# Patient Record
Sex: Female | Born: 1935 | Race: White | Hispanic: No | Smoking: Former smoker
Health system: Southern US, Community
[De-identification: ages and names within clinical notes are randomized; demographics above are authoritative.]

## PROBLEM LIST (undated history)

## (undated) DIAGNOSIS — R52 Pain, unspecified: Secondary | ICD-10-CM

## (undated) DIAGNOSIS — I509 Heart failure, unspecified: Secondary | ICD-10-CM

## (undated) DIAGNOSIS — E039 Hypothyroidism, unspecified: Secondary | ICD-10-CM

## (undated) DIAGNOSIS — E785 Hyperlipidemia, unspecified: Secondary | ICD-10-CM

## (undated) DIAGNOSIS — J40 Bronchitis, not specified as acute or chronic: Secondary | ICD-10-CM

## (undated) DIAGNOSIS — I10 Essential (primary) hypertension: Secondary | ICD-10-CM

## (undated) DIAGNOSIS — D649 Anemia, unspecified: Secondary | ICD-10-CM

## (undated) DIAGNOSIS — K219 Gastro-esophageal reflux disease without esophagitis: Secondary | ICD-10-CM

## (undated) DIAGNOSIS — E7800 Pure hypercholesterolemia, unspecified: Secondary | ICD-10-CM

## (undated) HISTORY — PX: WRIST SURGERY: SHX841

## (undated) HISTORY — PX: ABDOMINAL HYSTERECTOMY: SHX81

---

## 2009-02-15 ENCOUNTER — Emergency Department (HOSPITAL_COMMUNITY): Admission: EM | Admit: 2009-02-15 | Discharge: 2009-02-15 | Payer: Self-pay | Admitting: Emergency Medicine

## 2010-12-16 LAB — DIFFERENTIAL
Basophils Absolute: 0 10*3/uL (ref 0.0–0.1)
Basophils Relative: 0 % (ref 0–1)
Eosinophils Absolute: 0.1 10*3/uL (ref 0.0–0.7)
Neutro Abs: 4.6 10*3/uL (ref 1.7–7.7)
Neutrophils Relative %: 74 % (ref 43–77)

## 2010-12-16 LAB — POCT CARDIAC MARKERS
Myoglobin, poc: 63.3 ng/mL (ref 12–200)
Myoglobin, poc: 84.2 ng/mL (ref 12–200)
Troponin i, poc: <0.05 ng/mL (ref 0.00–0.09)

## 2010-12-16 LAB — POCT I-STAT, CHEM 8
Calcium, Ion: 1.22 mmol/L (ref 1.12–1.32)
Creatinine, Ser: 1.3 mg/dL — ABNORMAL HIGH (ref 0.4–1.2)
Hemoglobin: 14.6 g/dL (ref 12.0–15.0)
Sodium: 141 mEq/L (ref 135–145)
TCO2: 28 mmol/L (ref 0–100)

## 2010-12-16 LAB — CBC
MCHC: 34 g/dL (ref 30.0–36.0)
MCV: 92.9 fL (ref 78.0–100.0)
RDW: 14.1 % (ref 11.5–15.5)

## 2010-12-16 LAB — LIPASE, BLOOD: Lipase: 21 U/L (ref 11–59)

## 2011-07-20 ENCOUNTER — Encounter: Payer: Self-pay | Admitting: *Deleted

## 2011-07-20 ENCOUNTER — Emergency Department (HOSPITAL_COMMUNITY): Payer: Medicare (Managed Care)

## 2011-07-20 ENCOUNTER — Emergency Department (HOSPITAL_COMMUNITY)
Admission: EM | Admit: 2011-07-20 | Discharge: 2011-07-28 | Disposition: A | Discharge status: Other Acute Inpatient Hospital | Payer: Medicare (Managed Care) | Attending: Emergency Medicine | Admitting: Emergency Medicine

## 2011-07-20 DIAGNOSIS — F411 Generalized anxiety disorder: Secondary | ICD-10-CM | POA: Insufficient documentation

## 2011-07-20 DIAGNOSIS — E039 Hypothyroidism, unspecified: Secondary | ICD-10-CM | POA: Insufficient documentation

## 2011-07-20 DIAGNOSIS — F29 Unspecified psychosis not due to a substance or known physiological condition: Secondary | ICD-10-CM | POA: Insufficient documentation

## 2011-07-20 DIAGNOSIS — F419 Anxiety disorder, unspecified: Secondary | ICD-10-CM

## 2011-07-20 DIAGNOSIS — I517 Cardiomegaly: Secondary | ICD-10-CM | POA: Insufficient documentation

## 2011-07-20 DIAGNOSIS — K449 Diaphragmatic hernia without obstruction or gangrene: Secondary | ICD-10-CM | POA: Insufficient documentation

## 2011-07-20 DIAGNOSIS — I4891 Unspecified atrial fibrillation: Secondary | ICD-10-CM | POA: Insufficient documentation

## 2011-07-20 DIAGNOSIS — E7800 Pure hypercholesterolemia, unspecified: Secondary | ICD-10-CM | POA: Insufficient documentation

## 2011-07-20 DIAGNOSIS — I10 Essential (primary) hypertension: Secondary | ICD-10-CM | POA: Insufficient documentation

## 2011-07-20 DIAGNOSIS — N390 Urinary tract infection, site not specified: Secondary | ICD-10-CM | POA: Insufficient documentation

## 2011-07-20 DIAGNOSIS — M5030 Other cervical disc degeneration, unspecified cervical region: Secondary | ICD-10-CM | POA: Insufficient documentation

## 2011-07-20 DIAGNOSIS — J4489 Other specified chronic obstructive pulmonary disease: Secondary | ICD-10-CM | POA: Insufficient documentation

## 2011-07-20 DIAGNOSIS — S0990XA Unspecified injury of head, initial encounter: Secondary | ICD-10-CM | POA: Insufficient documentation

## 2011-07-20 DIAGNOSIS — G319 Degenerative disease of nervous system, unspecified: Secondary | ICD-10-CM | POA: Insufficient documentation

## 2011-07-20 DIAGNOSIS — W108XXA Fall (on) (from) other stairs and steps, initial encounter: Secondary | ICD-10-CM | POA: Insufficient documentation

## 2011-07-20 DIAGNOSIS — J449 Chronic obstructive pulmonary disease, unspecified: Secondary | ICD-10-CM | POA: Insufficient documentation

## 2011-07-20 DIAGNOSIS — R6889 Other general symptoms and signs: Secondary | ICD-10-CM | POA: Insufficient documentation

## 2011-07-20 DIAGNOSIS — Z79899 Other long term (current) drug therapy: Secondary | ICD-10-CM | POA: Insufficient documentation

## 2011-07-20 HISTORY — DX: Hypothyroidism, unspecified: E03.9

## 2011-07-20 HISTORY — DX: Pure hypercholesterolemia, unspecified: E78.00

## 2011-07-20 HISTORY — DX: Essential (primary) hypertension: I10

## 2011-07-20 HISTORY — DX: Pain, unspecified: R52

## 2011-07-20 HISTORY — DX: Gastro-esophageal reflux disease without esophagitis: K21.9

## 2011-07-20 LAB — COMPREHENSIVE METABOLIC PANEL
ALT: 18 U/L (ref 0–35)
AST: 28 U/L (ref 0–37)
Alkaline Phosphatase: 76 U/L (ref 39–117)
Calcium: 10.6 mg/dL — ABNORMAL HIGH (ref 8.4–10.5)
GFR calc Af Amer: 57 mL/min — ABNORMAL LOW (ref >90)
Glucose, Bld: 92 mg/dL (ref 70–99)
Potassium: 4.2 mEq/L (ref 3.5–5.1)
Sodium: 139 mEq/L (ref 135–145)
Total Protein: 6.8 g/dL (ref 6.0–8.3)

## 2011-07-20 LAB — URINALYSIS, ROUTINE W REFLEX MICROSCOPIC
Bilirubin Urine: NEGATIVE
Glucose, UA: NEGATIVE mg/dL
Hgb urine dipstick: NEGATIVE
Specific Gravity, Urine: 1.02 (ref 1.005–1.030)
pH: 5 (ref 5.0–8.0)

## 2011-07-20 LAB — CBC
HCT: 43.3 % (ref 36.0–46.0)
Hemoglobin: 14.7 g/dL (ref 12.0–15.0)
MCH: 31.8 pg (ref 26.0–34.0)
MCHC: 33.9 g/dL (ref 30.0–36.0)
MCV: 93.7 fL (ref 78.0–100.0)
RBC: 4.62 MIL/uL (ref 3.87–5.11)

## 2011-07-20 LAB — URINE MICROSCOPIC-ADD ON

## 2011-07-20 MED ORDER — SIMVASTATIN 10 MG PO TABS
10.0000 mg | ORAL_TABLET | Freq: Every day | ORAL | Status: DC
  Administered 2011-07-24 – 2011-07-27 (×4): 10 mg via ORAL
  Filled 2011-07-20 (×12): qty 1

## 2011-07-20 MED ORDER — RISPERIDONE 0.5 MG PO TABS
0.5000 mg | ORAL_TABLET | Freq: Every day | ORAL | Status: AC
  Filled 2011-07-20 (×2): qty 1

## 2011-07-20 MED ORDER — LOSARTAN POTASSIUM 50 MG PO TABS
50.0000 mg | ORAL_TABLET | Freq: Every day | ORAL | Status: DC
  Administered 2011-07-21 – 2011-07-27 (×6): 50 mg via ORAL
  Filled 2011-07-20 (×12): qty 1

## 2011-07-20 MED ORDER — CEPHALEXIN 500 MG PO CAPS
500.0000 mg | ORAL_CAPSULE | Freq: Once | ORAL | Status: AC
  Administered 2011-07-20: 500 mg via ORAL
  Filled 2011-07-20: qty 1

## 2011-07-20 MED ORDER — LEVOTHYROXINE SODIUM 75 MCG PO TABS
75.0000 ug | ORAL_TABLET | Freq: Every day | ORAL | Status: DC
  Administered 2011-07-21 – 2011-07-28 (×7): 75 ug via ORAL
  Filled 2011-07-20 (×10): qty 1

## 2011-07-20 NOTE — BH Assessment (Signed)
Assessment Note   Yvonne Donovan is an 77 y.o. white  female.  PRESENTING PROBLEM: Yvonne Donovan was brought to [facility] Emergency Room because she locked her self in the bath room and called 911.  Doyle reported to 911 that, "a man named Jillyn Hidden was trying to kill her and that this man had already killed her other Donovan.  Yvonne Donovan reports seeing and hearing voices and shadows.  Yvonne Donovan and her Donovan report that she has been displaying this type of behaviors for the past month.  Pt. Denies any SI/HI.  Yvonne Donovan reports that her daughter in law is in a relationship with her hallucination, "Xandra Laramee reports that she is afraid to come to her home with her Donovan.  Yvonne Donovan's Donovan reports that she fell down a flight of stairs in July 2012.  Yvonne Donovan reports that on Tuesday of last week his mother went outside at 2:30 am by herself.  Yvonne Donovan reports that she is not able to sleep because she is afraid for her life.  Yvonne Donovan reports that her family has been lying to her and they are allowing a man named Jillyn Hidden to steal from her.  Therefore, his mother was presents a present danger to herself due to her level of psychosis.  Yvonne Donovan's medical reports states that she is in very good health and her only medical issues are associated with high blood pressure that is controllable with medication.  Yvonne Donovan reports that she has never received any counseling in the past.  Yvonne Donovan denies any substance abuse in the past.    FOLLOW UP: Dr. Anitra Lauth ordered a CT scan to rule out an neurological issues due to her falling down a fight of stairs in July 2012.  Essance was prescribed Risperdal 0.5 mg qHS.    DISPOSITION:  Yvonne Donovan is pending placement at [facility].        Axis I: See current hospital problem list  296.9 Mood Disorder, NOS Axis II: Deferred Axis III:  Past Medical History  Diagnosis Date  . Hypertension   . Constipation   . Pain   . Acid reflux   . Hypercholesteremia   . Hypothyroid    Axis IV: other psychosocial or  environmental problems, problems related to social environment and problems with access to health care services Axis V: 31-40 impairment in reality testing   Past Medical History:  Past Medical History  Diagnosis Date  . Hypertension   . Constipation   . Pain   . Acid reflux   . Hypercholesteremia   . Hypothyroid     Past Surgical History  Procedure Date  . Wrist surgery   . Abdominal hysterectomy     Family History: History reviewed. No pertinent family history.  Social History:  reports that she has never smoked. She does not have any smokeless tobacco history on file. She reports that she drinks alcohol. Her drug history not on file.  Allergies: No Known Allergies  Home Medications:  Medications Prior to Admission  Medication Dose Route Frequency Provider Last Rate Last Dose  . cephALEXin (KEFLEX) capsule 500 mg  500 mg Oral Once Suzi Roots, MD   500 mg at 07/20/11 1759  . risperiDONE (RISPERDAL) tablet 0.5 mg  0.5 mg Oral QHS Gwyneth Sprout, MD       No current outpatient prescriptions on file as of 07/20/2011.    OB/GYN Status:  No LMP recorded.  General Assessment Data Living Arrangements: Children Can pt return to current living arrangement?: Yes  Admission Status: Voluntary Is patient capable of signing voluntary admission?: Yes Transfer from: Home Referral Source: Self/Family/Friend  Risk to self Suicidal Ideation: No Suicidal Intent: No Is patient at risk for suicide?: No Suicidal Plan?: No Access to Means: No What has been your use of drugs/alcohol within the last 12 months?:  (None) Other Self Harm Risks: None Triggers for Past Attempts: Unknown Intentional Self Injurious Behavior: None Factors that decrease suicide risk:  (N/A) Family Suicide History: No Recent stressful life event(s): Other (Comment) (Recently moved to [place] to live with her Donovan. ) Persecutory voices/beliefs?: Yes Depression: Yes Depression Symptoms:  Insomnia;Tearfulness;Fatigue;Loss of interest in usual pleasures;Feeling angry/irritable;Feeling worthless/self pity Substance abuse history and/or treatment for substance abuse?: No Suicide prevention information given to non-admitted patients: Not applicable  Risk to Others Homicidal Ideation: No Thoughts of Harm to Others: No Current Homicidal Intent: No Current Homicidal Plan: No Access to Homicidal Means: No Identified Victim:  (N/A) History of harm to others?: No Assessment of Violence: None Noted Violent Behavior Description:  (N/A) Does patient have access to weapons?: No Criminal Charges Pending?: No Does patient have a court date: No  Mental Status Report Appear/Hygiene: Bizarre Eye Contact: Poor Motor Activity: Agitation;Restlessness;Unsteady Speech: Pressured;Slow Level of Consciousness: Alert;Restless;Irritable Mood: Anxious;Suspicious;Fearful Affect: Anxious;Fearful;Frightened;Irritable Anxiety Level: Minimal Thought Processes: Flight of Ideas;Tangential Judgement: Impaired Orientation: Person Obsessive Compulsive Thoughts/Behaviors: None  Cognitive Functioning Concentration: Decreased Memory: Remote Impaired;Recent Impaired IQ: Average Insight: Poor Impulse Control: Poor Appetite: Fair Weight Loss:  (N/A) Weight Gain:  (N/A) Sleep: Decreased Total Hours of Sleep: 3  Vegetative Symptoms: None  Prior Inpatient/Outpatient Therapy Prior Therapy:  (N/A) Prior Therapy Dates:  (N/A) Prior Therapy Facilty/Provider(s):  (N/A) Reason for Treatment:  (N/A)            Values / Beliefs Cultural Requests During Hospitalization: None Spiritual Requests During Hospitalization: None        Additional Information 1:1 In Past 12 Months?: Yes CIRT Risk: No Elopement Risk: No Does patient have medical clearance?: Yes     Disposition:     On Site Evaluation by:   Reviewed with Physician:     Phillip Heal LaVerne 07/20/2011 10:13 PM

## 2011-07-20 NOTE — ED Provider Notes (Addendum)
History     CSN: [id number] Arrival date & time: 07/20/2011 12:20 PM   First MD Initiated Contact with Patient 07/20/11 1253      Chief Complaint  Patient presents with  . Anxiety    (Consider location/radiation/quality/duration/timing/severity/associated sxs/prior treatment) HPI  Past Medical History  Diagnosis Date  . Hypertension   . Constipation   . Pain   . Acid reflux   . Hypercholesteremia   . Hypothyroid     Past Surgical History  Procedure Date  . Wrist surgery   . Abdominal hysterectomy     History reviewed. No pertinent family history.  History  Substance Use Topics  . Smoking status: Never Smoker   . Smokeless tobacco: Not on file  . Alcohol Use: Yes    OB History    Grav Para Term Preterm Abortions TAB SAB Ect Mult Living                  Review of Systems  Allergies  Review of patient's allergies indicates no known allergies.  Home Medications   Current Outpatient Rx  Name Route Sig Dispense Refill  . CALCIUM CARBONATE-VITAMIN D 600-400 MG-UNIT PO TABS Oral Take 1 tablet by mouth daily.      Marland Kitchen DOCUSATE SODIUM 100 MG PO CAPS Oral Take 100 mg by mouth 2 (two) times daily.      Marland Kitchen LEVOTHYROXINE SODIUM 75 MCG PO TABS Oral Take 75 mcg by mouth daily.      Marland Kitchen LOSARTAN POTASSIUM 50 MG PO TABS Oral Take 50 mg by mouth daily.      Marland Kitchen EYE VITAMINS PO Oral Take 1 tablet by mouth daily.      Carma Leaven M PLUS PO TABS Oral Take 1 tablet by mouth daily.      Marland Kitchen RANITIDINE HCL 150 MG PO CAPS Oral Take 150 mg by mouth daily.     Marland Kitchen SIMVASTATIN 10 MG PO TABS Oral Take 10 mg by mouth at bedtime.        BP 189/88  Pulse 88  Temp(Src) 98.7 F (37.1 C) (Oral)  Resp 20  SpO2 96%  Physical Exam  ED Course  Procedures (including critical care time)  Labs Reviewed  COMPREHENSIVE METABOLIC PANEL - Abnormal; Notable for the following:    Calcium 10.6 (*)    GFR calc non Af Amer 49 (*)    GFR calc Af Amer 57 (*)    All other components within normal  limits  URINALYSIS, ROUTINE W REFLEX MICROSCOPIC - Abnormal; Notable for the following:    Appearance CLOUDY (*)    Ketones, ur TRACE (*)    Leukocytes, UA SMALL (*)    All other components within normal limits  CBC - Abnormal; Notable for the following:    Platelets 146 (*)    All other components within normal limits  URINE MICROSCOPIC-ADD ON - Abnormal; Notable for the following:    Squamous Epithelial / LPF FEW (*)    Bacteria, UA MANY (*)    All other components within normal limits   No results found.   1. Anxiety   2. Urinary tract infection       MDM   Pt seen by telepsych and recommended home with f/u and start rispiradol 0.5mg  qhs for paranoid delusions.  Will also have her continue medication for UTI. Discussed with family and they feel she is unable to go home based on harm to self or others.  Pt refusing to go back  to home cause states daughter in law is going to kill her.  Will treat UTI and head CT neg as she fell down the stairs 3 days ago.  Will start the rispiradol and ACT saw pt and looking for placement at [facility].        Gwyneth Sprout, MD 07/20/11 1655  Gwyneth Sprout, MD 07/20/11 2300

## 2011-07-20 NOTE — ED Notes (Signed)
Pt transferred to [facility].

## 2011-07-20 NOTE — ED Provider Notes (Addendum)
History     CSN: [id number] Arrival date & time: 07/20/2011 12:20 PM   First MD Initiated Contact with Patient 07/20/11 1253      Chief Complaint  Patient presents with  . Anxiety    (Consider location/radiation/quality/duration/timing/severity/associated sxs/prior treatment) Patient is a 77 y.o. female presenting with anxiety. The history is provided by the patient and a relative.  Anxiety Pertinent negatives include no chest pain, no abdominal pain, no headaches and no shortness of breath.  pt just recently moved down from [place] to spend time with her son/family here. In past week, periods of anxiety and paranoia. Today states heard noises outside, became anxious, called 911. ?hx same. Pt denies any recent change in medication. Denies any recent health problems or other symptoms, states feels fine. Eating/drinking normally. States sleeping ok at night.   Past Medical History  Diagnosis Date  . Hypertension   . Constipation   . Pain   . Acid reflux   . Hypercholesteremia   . Hypothyroid     Past Surgical History  Procedure Date  . Wrist surgery   . Abdominal hysterectomy     History reviewed. No pertinent family history.  History  Substance Use Topics  . Smoking status: Never Smoker   . Smokeless tobacco: Not on file  . Alcohol Use: Yes    OB History    Grav Para Term Preterm Abortions TAB SAB Ect Mult Living                  Review of Systems  Constitutional: Negative for fever.  HENT: Negative for neck pain.   Eyes: Negative for visual disturbance.  Respiratory: Negative for shortness of breath.   Cardiovascular: Negative for chest pain.  Gastrointestinal: Negative for abdominal pain.  Genitourinary: Negative for dysuria.  Musculoskeletal: Negative for back pain.  Skin: Negative for rash.  Neurological: Negative for headaches.  Hematological: Does not bruise/bleed easily.  Psychiatric/Behavioral: The patient is nervous/anxious.     Allergies   Review of patient's allergies indicates no known allergies.  Home Medications   Current Outpatient Rx  Name Route Sig Dispense Refill  . ASPIRIN 81 MG PO CHEW Oral Chew 81 mg by mouth daily.      Marland Kitchen CALCIUM CARBONATE 200 MG PO CAPS Oral Take 250 mg by mouth 2 (two) times daily with a meal.      . DOCUSATE SODIUM 100 MG PO CAPS Oral Take 100 mg by mouth 2 (two) times daily.      Marland Kitchen HYDROCODONE-ACETAMINOPHEN 5-500 MG PO CAPS Oral Take 1 capsule by mouth every 6 (six) hours as needed.      Marland Kitchen LEVOTHYROXINE SODIUM 75 MCG PO TABS Oral Take 75 mcg by mouth daily.      Marland Kitchen LOSARTAN POTASSIUM 50 MG PO TABS Oral Take 50 mg by mouth daily.      . MULTIVITAMINS PO CAPS Oral Take 1 capsule by mouth daily.      . ANTIOXIDANT FORMULA SG PO CPCR Oral Take 1 capsule by mouth daily.      . CENTRUM PO CHEW Oral Chew 1 tablet by mouth daily.      Marland Kitchen RANITIDINE HCL 150 MG PO CAPS Oral Take 150 mg by mouth 2 (two) times daily.      Marland Kitchen SIMVASTATIN 10 MG PO TABS Oral Take 10 mg by mouth at bedtime.        BP 177/88  Pulse 79  Temp(Src) 98.5 F (36.9 C) (Oral)  Resp 18  SpO2 97%  Physical Exam  Nursing note and vitals reviewed. Constitutional: She is oriented to person, place, and time. She appears well-developed and well-nourished. No distress.  HENT:  Head: Atraumatic.  Eyes: Conjunctivae are normal. Pupils are equal, round, and reactive to light. No scleral icterus.  Neck: Neck supple. No tracheal deviation present. No thyromegaly present.  Cardiovascular: Normal rate, regular rhythm, normal heart sounds and intact distal pulses.   Pulmonary/Chest: Effort normal and breath sounds normal. No respiratory distress.  Abdominal: Soft. Normal appearance. She exhibits no distension. There is no tenderness.  Musculoskeletal: She exhibits no edema.  Neurological: She is alert and oriented to person, place, and time.       Alert, content. Motor intact.   Skin: Skin is warm and dry. No rash noted.  Psychiatric:  She has a normal mood and affect.       Pt alert, content. Notes felt anxious/upset earlier.     ED Course  Procedures (including critical care time)   Labs Reviewed  CBC  COMPREHENSIVE METABOLIC PANEL  URINALYSIS, ROUTINE W REFLEX MICROSCOPIC   No results found.  Results for orders placed during the hospital encounter of 07/20/11  COMPREHENSIVE METABOLIC PANEL      Component Value Range   Sodium 139  135 - 145 (mEq/L)   Potassium 4.2  3.5 - 5.1 (mEq/L)   Chloride 103  96 - 112 (mEq/L)   CO2 26  19 - 32 (mEq/L)   Glucose, Bld 92  70 - 99 (mg/dL)   BUN 17  6 - 23 (mg/dL)   Creatinine, Ser 1.61  0.50 - 1.10 (mg/dL)   Calcium 09.6 (*) 8.4 - 10.5 (mg/dL)   Total Protein 6.8  6.0 - 8.3 (g/dL)   Albumin 4.2  3.5 - 5.2 (g/dL)   AST 28  0 - 37 (U/L)   ALT 18  0 - 35 (U/L)   Alkaline Phosphatase 76  39 - 117 (U/L)   Total Bilirubin 1.2  0.3 - 1.2 (mg/dL)   GFR calc non Af Amer 49 (*) >90 (mL/min)   GFR calc Af Amer 57 (*) >90 (mL/min)  URINALYSIS, ROUTINE W REFLEX MICROSCOPIC      Component Value Range   Color, Urine YELLOW  YELLOW    Appearance CLOUDY (*) CLEAR    Specific Gravity, Urine 1.020  1.005 - 1.030    pH 5.0  5.0 - 8.0    Glucose, UA NEGATIVE  NEGATIVE (mg/dL)   Hgb urine dipstick NEGATIVE  NEGATIVE    Bilirubin Urine NEGATIVE  NEGATIVE    Ketones, ur TRACE (*) NEGATIVE (mg/dL)   Protein, ur NEGATIVE  NEGATIVE (mg/dL)   Urobilinogen, UA 0.2  0.0 - 1.0 (mg/dL)   Nitrite NEGATIVE  NEGATIVE    Leukocytes, UA SMALL (*) NEGATIVE   CBC      Component Value Range   WBC 7.1  4.0 - 10.5 (K/uL)   RBC 4.62  3.87 - 5.11 (MIL/uL)   Hemoglobin 14.7  12.0 - 15.0 (g/dL)   HCT 04.5  40.9 - 81.1 (%)   MCV 93.7  78.0 - 100.0 (fL)   MCH 31.8  26.0 - 34.0 (pg)   MCHC 33.9  30.0 - 36.0 (g/dL)   RDW 91.4  78.2 - 95.6 (%)   Platelets 146 (*) 150 - 400 (K/uL)  URINE MICROSCOPIC-ADD ON      Component Value Range   Squamous Epithelial / LPF FEW (*) RARE    WBC, UA 11-20  <  3  (WBC/hpf)   Bacteria, UA MANY (*) RARE    No results found.   No diagnosis found.    MDM  Labs sent. Will get telepsych consult re possible med recom for home re recent feelings anxiety/paranoia.    ua w 11-20 wbc. Will rx keflex. telepsych consult pending regarding recommendations for home. Signed out to Dr Anitra Lauth to f/u with telepsych recommendations, anticipate d/c home w their med recs and rx for uti.      Suzi Roots, MD 07/20/11 1535  Suzi Roots, MD 07/20/11 [phone]

## 2011-07-20 NOTE — ED Notes (Signed)
Assisted pt to bathroom where she urinated.

## 2011-07-20 NOTE — ED Notes (Signed)
EMS called to home.  Found patient.  Patient has had an increase in anxiety over the past week with paranoia per family.   She has recently moved from [place].  Patient is currently cooperative and answers appropriately.

## 2011-07-20 NOTE — ED Notes (Signed)
ZOX:[id number]<AV> Expected date:07/20/11<BR> Expected time:12:03 PM<BR> Means of arrival:Ambulance<BR> Comments:<BR> EMS 11 GC - anxiety

## 2011-07-20 NOTE — ED Notes (Signed)
WUJ:[id number]<JY> Expected date:<BR> Expected time:<BR> Means of arrival:<BR> Comments:<BR> Patient still in room

## 2011-07-20 NOTE — ED Notes (Signed)
ED physician called in regards to pt's status.

## 2011-07-21 ENCOUNTER — Emergency Department (HOSPITAL_COMMUNITY): Payer: Medicare (Managed Care)

## 2011-07-21 ENCOUNTER — Other Ambulatory Visit: Payer: Self-pay

## 2011-07-21 MED ORDER — CEPHALEXIN 500 MG PO CAPS
500.0000 mg | ORAL_CAPSULE | Freq: Four times a day (QID) | ORAL | Status: AC
  Administered 2011-07-21 – 2011-07-28 (×13): 500 mg via ORAL
  Filled 2011-07-21 (×14): qty 1

## 2011-07-21 MED ORDER — ASPIRIN 325 MG PO TABS
ORAL_TABLET | ORAL | Status: AC
  Administered 2011-07-21: 325 mg via ORAL
  Filled 2011-07-21: qty 1

## 2011-07-21 MED ORDER — ACETAMINOPHEN 325 MG PO TABS
650.0000 mg | ORAL_TABLET | Freq: Once | ORAL | Status: AC
  Administered 2011-07-21: 650 mg via ORAL
  Filled 2011-07-21: qty 2

## 2011-07-21 MED ORDER — ONDANSETRON HCL 4 MG PO TABS: 4.0000 mg | ORAL_TABLET | Freq: Three times a day (TID) | ORAL | Status: DC | PRN

## 2011-07-21 MED ORDER — LORAZEPAM 0.5 MG PO TABS
0.5000 mg | ORAL_TABLET | Freq: Three times a day (TID) | ORAL | Status: DC | PRN
  Administered 2011-07-24 – 2011-07-27 (×4): 0.5 mg via ORAL
  Filled 2011-07-21 (×3): qty 1

## 2011-07-21 MED ORDER — ALUM & MAG HYDROXIDE-SIMETH 200-200-20 MG/5ML PO SUSP: 30.0000 mL | ORAL | Status: DC | PRN

## 2011-07-21 MED ORDER — ACETAMINOPHEN 325 MG PO TABS: 650.0000 mg | ORAL_TABLET | ORAL | Status: DC | PRN

## 2011-07-21 MED ORDER — ASPIRIN EC 325 MG PO TBEC: 325.0000 mg | DELAYED_RELEASE_TABLET | Freq: Every day | ORAL | Status: DC

## 2011-07-21 MED ORDER — LORAZEPAM 1 MG PO TABS
1.0000 mg | ORAL_TABLET | Freq: Once | ORAL | Status: AC
  Administered 2011-07-21: 1 mg via ORAL
  Filled 2011-07-21: qty 1

## 2011-07-21 NOTE — ED Notes (Signed)
Pt became very agitated and wanted to get out of bed to "go across the hall." Pt would not state what was across the hall. Pt yelling and uncooperative. Pt eventually agreed to take meds and sit back in bed. Pt is lying in bed with covers on, NAD noted at this time.

## 2011-07-21 NOTE — ED Notes (Signed)
Dr alerted to pt BP vitals

## 2011-07-21 NOTE — ED Notes (Signed)
Dr. Effie Shy given pt's medical records that were faxed over from previous hospital admission.

## 2011-07-21 NOTE — ED Notes (Signed)
Asked Dr if blood pressure medication should be given now due to vitals. Dr agreed. Medication request sent to pharmacy.

## 2011-07-21 NOTE — ED Notes (Signed)
EKG and chest xray results faxed to [facility] per request.

## 2011-07-21 NOTE — ED Notes (Signed)
Pt has been accepted at [facility]. Waiting for Sheriff's office to bring IVC paperwork and transport pt over to facility.

## 2011-07-21 NOTE — Progress Notes (Signed)
Pt not accepted at [facility].  Will be holding in ED.  Holding orders written, including keflex for UTI.  Pending placement.

## 2011-07-21 NOTE — ED Notes (Signed)
Per ACT Team pt has been declined at [facility]. States they have sent info to [facility] and [facility] for consideration. Dr. Jeraldine Loots notified.

## 2011-07-21 NOTE — ED Notes (Signed)
Pt drank coffee and ate small half of bagel and one piece of bacon. Pt  asking where her purse is, (purse not in room when RN checked). Then Pt  Answered herself stating "I think Yvonne Donovan took it with him". Pt alert and oriented x4. Respirations even and unlabored. In no acute distress. Denies needs.

## 2011-07-21 NOTE — ED Notes (Signed)
Alicia from [facility] called and stated that they would not accept pt until chest xray was clear. Dr. Jeraldine Loots notified and states pt has chronic disease and chest xray would never be clear and wants ACT team to follow up. ACT team notified of need for follow-up regarding pt's status.

## 2011-07-21 NOTE — Progress Notes (Signed)
Pt apparently finished her dinner and while the bedside table was rolled away, pt fell on the floor.  No LOC, pt states she "hit here" and points to her left head/face.  States "I'm ok."  Now back in bed watching TV.  Will check CT.

## 2011-07-21 NOTE — ED Notes (Signed)
Pt was sitting up in bed eating dinner tray. Per [place], NT pt rolled bedside table away and rolled out of bed. Bed was set in low position. Pt did not lose consciousness. Reports pain to left cheek, small bruise noted. No other complaints. Pt remains alert and oriented per baseline. Pt cleaned up and changed, linens changed.

## 2011-07-21 NOTE — ED Notes (Signed)
Pt is alert. Skin warm and dry. Respirations even and unlabored. NAD noted at this time. Denies SI/HI or hallucinations at this time.

## 2011-07-21 NOTE — ED Notes (Signed)
Pt alert and oriented x4. Respirations even and unlabored. Skin warm and dry. In no acute distress. Denies needs. Pt is sitting up on bed with legs. Pt is less agitated. Cooperative when encouraged to take medications. Still request we bring her sister over from the hall (even though her sister is not here). Also request the President to talk to her sister. Pt talking to herself outloud.

## 2011-07-21 NOTE — ED Provider Notes (Addendum)
Routine EKG ordered   Date: 07/21/2011  Rate: 89   Rhythm: atrial fibrillation  QRS Axis: normal  Intervals: QT normal  ST/T Wave abnormalities: normal  Conduction Disutrbances:none  Narrative Interpretation:   Old EKG Reviewed: none available  I was able to obtain records from [facility] in [place]. Patient had an EKG done 03/13/2011 that showed atrial fibrillation at rate 75. There are noassociated clinical records done at that time. This indicates that her atrial fibrillation seen on EKG today is pre-existing. Further evaluation for this rhythm or intervention is not necessary. It would be important that she takes one aspirin, full-strength, each day as a preventative for strokes. The patient has no allergies. The patient is medically cleared for disposition and transfer to a psychiatric facility. Daily. Aspirin is ordered here until she leaves.  Flint Melter, MD 07/21/11 [phone]   Continuity note; patient is still awaiting placement. Overnight, the facility that was contemplating admitting her requested additional evaluations with repeat EKG and cardiac screening tests.   Date: 07/27/2011  Rate: 95  Rhythm: atrial fibrillation  QRS Axis: normal  Intervals: QT normal  ST/T Wave abnormalities: normal  Conduction Disutrbances:none  Narrative Interpretation:   Old EKG Reviewed: unchanged  Lab tests today: BP Max normal except glucose high 156. CBC essentially normal. BNP is slightly elevated at 380. The repeat vital signs this evening at 1804 were normal with oxygen saturation normal. 100% on room air.  Assessment: The patient continues to be medically cleared and stable for discharge to a psychiatric facility.    Flint Melter, MD 07/27/11 [id number]

## 2011-07-21 NOTE — ED Notes (Signed)
Pt becoming increasingly agitated. Trying to get out of bed to go see her son who she believes is at the hospital, but is not. States that everyone here are liars. MD alerted to pts behavior.

## 2011-07-21 NOTE — ED Provider Notes (Signed)
Patient's transfer to [facility] was denied. The patient's x-ray today demonstrates more critically, and the official reading is that pneumonia cannot be excluded. The patient did not present with any respiratory complaints or any notable pain.  Her vital signs are unremarkable as well, with good oxygenation on room air. Additionall dispositions are being evaluated for this patient  Gerhard Munch, MD 07/21/11 1454

## 2011-07-21 NOTE — ED Notes (Signed)
Patient returned from CT.Charge nurse, Terri notified that patient needed Recruitment consultant. Informed that no sitters available at this time. Patient to be transferred to [facility] for closer monitoring.

## 2011-07-21 NOTE — ED Notes (Signed)
Daughter in law at bedside  Daughter in law Juanette Urizar # [phone] Son Arelyn Gauer #[phone]  Daughter in  [place] states Yvonne Donovan is power of attorney

## 2011-07-22 MED ORDER — LORAZEPAM 2 MG/ML IJ SOLN
0.5000 mg | Freq: Once | INTRAMUSCULAR | Status: AC
  Administered 2011-07-22: 0.5 mg via INTRAVENOUS
  Filled 2011-07-22: qty 1

## 2011-07-22 MED ORDER — LORAZEPAM 0.5 MG PO TABS
ORAL_TABLET | ORAL | Status: AC
  Filled 2011-07-22: qty 1

## 2011-07-22 MED ORDER — LORAZEPAM 2 MG/ML IJ SOLN: 1.0000 mg | Freq: Once | INTRAMUSCULAR | Status: DC

## 2011-07-22 NOTE — ED Notes (Signed)
Pt up to restroom with assist by the sitter.

## 2011-07-22 NOTE — ED Provider Notes (Signed)
Pt trying to get out of bed, very verbally abusive. Yelling at staff.  States "I'm the boss, you're nobody". Reviewing her notes she has been refused at [facility] and family refuses to take her home. Still has IVC papers. Telepsych on 11/11 did not think she met criteria for psychiatric admission.  Waiting to hear from [facility]. Will also consult social work in case she cannot be placed in psychiatric facility.   Ward Givens, MD 07/22/11 [phone]

## 2011-07-22 NOTE — ED Provider Notes (Signed)
History     CSN: [id number] Arrival date & time: 07/20/2011 12:20 PM   First MD Initiated Contact with Patient 07/20/11 1253      Chief Complaint  Patient presents with  . Anxiety    (Consider location/radiation/quality/duration/timing/severity/associated sxs/prior treatment) HPI  Past Medical History  Diagnosis Date  . Hypertension   . Constipation   . Pain   . Acid reflux   . Hypercholesteremia   . Hypothyroid     Past Surgical History  Procedure Date  . Wrist surgery   . Abdominal hysterectomy     History reviewed. No pertinent family history.  History  Substance Use Topics  . Smoking status: Never Smoker   . Smokeless tobacco: Not on file  . Alcohol Use: Yes    OB History    Grav Para Term Preterm Abortions TAB SAB Ect Mult Living                  Review of Systems  Allergies  Review of patient's allergies indicates no known allergies.  Home Medications   Current Outpatient Rx  Name Route Sig Dispense Refill  . CALCIUM CARBONATE-VITAMIN D 600-400 MG-UNIT PO TABS Oral Take 1 tablet by mouth daily.      Marland Kitchen DOCUSATE SODIUM 100 MG PO CAPS Oral Take 100 mg by mouth 2 (two) times daily.      Marland Kitchen LEVOTHYROXINE SODIUM 75 MCG PO TABS Oral Take 75 mcg by mouth daily.      Marland Kitchen LOSARTAN POTASSIUM 50 MG PO TABS Oral Take 50 mg by mouth daily.      Marland Kitchen EYE VITAMINS PO Oral Take 1 tablet by mouth daily.      Carma Leaven M PLUS PO TABS Oral Take 1 tablet by mouth daily.      Marland Kitchen RANITIDINE HCL 150 MG PO CAPS Oral Take 150 mg by mouth daily.     Marland Kitchen SIMVASTATIN 10 MG PO TABS Oral Take 10 mg by mouth at bedtime.        BP 152/98  Pulse 75  Temp(Src) 98.1 F (36.7 C) (Oral)  Resp 18  SpO2 93%  Physical Exam  ED Course  Procedures (including critical care time)  Labs Reviewed  COMPREHENSIVE METABOLIC PANEL - Abnormal; Notable for the following:    Calcium 10.6 (*)    GFR calc non Af Amer 49 (*)    GFR calc Af Amer 57 (*)    All other components within normal  limits  URINALYSIS, ROUTINE W REFLEX MICROSCOPIC - Abnormal; Notable for the following:    Appearance CLOUDY (*)    Ketones, ur TRACE (*)    Leukocytes, UA SMALL (*)    All other components within normal limits  CBC - Abnormal; Notable for the following:    Platelets 146 (*)    All other components within normal limits  URINE MICROSCOPIC-ADD ON - Abnormal; Notable for the following:    Squamous Epithelial / LPF FEW (*)    Bacteria, UA MANY (*)    All other components within normal limits   Dg Chest 2 View  07/21/2011  *RADIOLOGY REPORT*  Clinical Data: Medical clearance.  CHEST - 2 VIEW  Comparison: Chest x-ray and CTA of the chest performed 02/15/2009  Findings: The lungs are well-aerated.  Vascular congestion is noted, with increased interstitial markings, raising question for mild interstitial edema.  This is more prominent at the left midlung zone, and mild pneumonia cannot be excluded.  There is no evidence of  pleural effusion or pneumothorax.  The heart is significantly enlarged.  Calcification is noted along the thoracic aorta.  A large hiatal hernia is again noted.  No acute osseous abnormalities are seen.  The patient's left shoulder hemiarthroplasty appears grossly intact.  IMPRESSION:  1.  Vascular congestion and significant cardiomegaly, with increased interstitial markings, raising question for mild interstitial edema.  This is more prominent at the left midlung zone, and mild pneumonia cannot be excluded. 2.  Large hiatal hernia again seen.  Original Report Authenticated By: Tonia Ghent, M.D.   Ct Head Wo Contrast  07/21/2011  *RADIOLOGY REPORT*  Clinical Data:  Larey Seat.  Clinical concern for a head or neck injury.  CT HEAD WITHOUT CONTRAST CT CERVICAL SPINE WITHOUT CONTRAST  Technique:  Multidetector CT imaging of the head and cervical spine was performed following the standard protocol without intravenous contrast.  Multiplanar CT image reconstructions of the cervical spine were  also generated.  Comparison:  Head CT dated 07/20/2011.  CT HEAD  Findings: The ventricles and subarachnoid spaces remain mildly enlarged.  No significant change in patchy white matter low density in both cerebral hemispheres.  Bilateral hyperostosis frontalis is again demonstrated.  No skull fracture, intracranial hemorrhage or paranasal sinus air-fluid levels.  Bilateral vertebral and cavernous internal carotid artery atheromatous calcifications.  IMPRESSION:  1.  No acute abnormality. 2.  Stable atrophy and chronic small vessel white matter ischemic changes.  CT CERVICAL SPINE  Findings: Multilevel degenerative changes.  No prevertebral soft tissue swelling, fractures or subluxations. Bilateral carotid artery calcifications.  IMPRESSION:  1.  Multilevel degenerative changes. 2.  No fracture or subluxation. 3.  Extensive bilateral carotid artery atheromatous calcifications.  Original Report Authenticated By: Darrol Angel, M.D.   Ct Head Wo Contrast  07/20/2011  *RADIOLOGY REPORT*  Clinical Data: Anxiety.  Hypertension.  CT HEAD WITHOUT CONTRAST  Technique:  Contiguous axial images were obtained from the base of the skull through the vertex without contrast.  Comparison: None.  Findings: There is no evidence of acute infarction, mass lesion, or intra- or extra-axial hemorrhage on CT.  Diffuse periventricular and subcortical white matter change likely reflects small vessel ischemic microangiopathy.  Small chronic lacunar infarcts are noted within the basal ganglia.  The posterior fossa, including the cerebellum, brainstem and fourth ventricle, is within normal limits.  The third and lateral ventricles are unremarkable in appearance.  The cerebral hemispheres demonstrate grossly normal gray-white differentiation. No mass effect or midline shift is seen.  There is no evidence of fracture; visualized osseous structures are unremarkable in appearance.  The orbits are within normal limits. The paranasal sinuses and  mastoid air cells are well-aerated.  No significant soft tissue abnormalities are seen.  Bilateral hearing aids are noted.  IMPRESSION:  1.  No acute intracranial pathology seen on CT. 2.  Diffuse small vessel ischemic microangiopathy. 3.  Likely small chronic lacunar infarcts within the basal ganglia.  Original Report Authenticated By: Tonia Ghent, M.D.   Ct Cervical Spine Wo Contrast  07/21/2011  *RADIOLOGY REPORT*  Clinical Data:  Larey Seat.  Clinical concern for a head or neck injury.  CT HEAD WITHOUT CONTRAST CT CERVICAL SPINE WITHOUT CONTRAST  Technique:  Multidetector CT imaging of the head and cervical spine was performed following the standard protocol without intravenous contrast.  Multiplanar CT image reconstructions of the cervical spine were also generated.  Comparison:  Head CT dated 07/20/2011.  CT HEAD  Findings: The ventricles and subarachnoid spaces remain mildly enlarged.  No  significant change in patchy white matter low density in both cerebral hemispheres.  Bilateral hyperostosis frontalis is again demonstrated.  No skull fracture, intracranial hemorrhage or paranasal sinus air-fluid levels.  Bilateral vertebral and cavernous internal carotid artery atheromatous calcifications.  IMPRESSION:  1.  No acute abnormality. 2.  Stable atrophy and chronic small vessel white matter ischemic changes.  CT CERVICAL SPINE  Findings: Multilevel degenerative changes.  No prevertebral soft tissue swelling, fractures or subluxations. Bilateral carotid artery calcifications.  IMPRESSION:  1.  Multilevel degenerative changes. 2.  No fracture or subluxation. 3.  Extensive bilateral carotid artery atheromatous calcifications.  Original Report Authenticated By: Darrol Angel, M.D.     1. Anxiety   2. Urinary tract infection       MDM  Note added in error.        Nat Christen, MD 07/22/11 [phone]

## 2011-07-22 NOTE — ED Notes (Signed)
Patient is resting comfortably. Sitter at bedside.  

## 2011-07-22 NOTE — ED Notes (Signed)
Patient is still sleeping at this time and sitter reamins with patient at this time

## 2011-07-22 NOTE — Progress Notes (Signed)
CSW faxed patient's information to [facility], [facility], [facility], [facility] and [facility] for possible disposition. CSW also faxed IVC to [facility] and it does not appear it was done yesterday and patient has not been served yet.  Will follow up and continue to work on [facility] placement.    CSW spoke with patient's son and they would also like to look into nursing home placement, but not long term as patient may be moved back to [place], they are just unable to care for her at home with paranoid symptoms.  CSW contacted patient's insurance and obtained  copay information. This information was provided to patient's son and daughter in law.  CSW will request that PT/OT consult be done to see if patient meets SNF needs. CSW will continue to provide support as needed.  Ileene Hutchinson , MSW, LCSWA 07/22/2011 3:02 PM

## 2011-07-22 NOTE — ED Notes (Signed)
Patient is resting comfortably. 

## 2011-07-22 NOTE — ED Notes (Signed)
Social Doctor, hospital on pt placement.

## 2011-07-23 ENCOUNTER — Emergency Department (HOSPITAL_COMMUNITY): Payer: Medicare (Managed Care)

## 2011-07-23 LAB — POCT I-STAT, CHEM 8
Calcium, Ion: 1.14 mmol/L (ref 1.12–1.32)
Chloride: 104 mEq/L (ref 96–112)
HCT: 49 % — ABNORMAL HIGH (ref 36.0–46.0)
Hemoglobin: 16.7 g/dL — ABNORMAL HIGH (ref 12.0–15.0)

## 2011-07-23 LAB — POCT I-STAT TROPONIN I: Troponin i, poc: 0.01 ng/mL (ref 0.00–0.08)

## 2011-07-23 NOTE — ED Notes (Signed)
Pt resting comfortable, requesting breakfast, explained that breakfast has been ordered, offered coffee, pt refused, refused am medications

## 2011-07-23 NOTE — Discharge Planning (Signed)
ED CM answered questions for son about differences in outpatient, observation and inpatient related to insurance coverage.  He voiced concern about pt refusing her medications while  In [facility] ED.  CM referred son to ED RN, Clydie Braun to check and contact EDP as needed.

## 2011-07-23 NOTE — ED Notes (Signed)
Attempted to give Keflex PO.  Pt. Refusing medications.

## 2011-07-23 NOTE — ED Notes (Signed)
CSW met with pt's son, Angelly Spearing, to review discharge plans. CSW explained that [facility] is still reviewing the pt's labs and has not given a disposition yet. Ron stated that he and his wife are in agreement for pt to be transferred to [facility] if she is accepted, however they are also looking at ALF's and need an FL2, which the CSWs are completing. CSW will continue to follow pt and family to ensure a safe discharge.

## 2011-07-23 NOTE — ED Notes (Signed)
-   pt AAO x 2;  Very animated;   Still refusing all her meds.    1:1 enforced

## 2011-07-23 NOTE — ED Provider Notes (Signed)
[facility] states that they will not accept patient without BNP. It was ordered.   Juliet Rude. Rubin Payor, MD 07/23/11 2014

## 2011-07-23 NOTE — Progress Notes (Signed)
No beds available at [facility] or [facility]. Spoke with Luther Parody at [facility]- she stated that she would fax their 4 page referral form for completion.  Pt. Discussed with Dr. Radford Pax-- another CXR was completed today which did not show any pneumonia.  Contacted Wallace Cullens- Intake at [facility]- and CXR faxed to her for review. Patient had been declined due to questionable finding of pneumonia on CXR.  Patient is medically stable for d/c per MD. Sherron Monday briefly with pt's daughter-in-law Clydie Braun and updated; attempting either psych hospital placement or short term SNF if patient qualifies for PT/OT. Awaiting eval per Physical Therapy. She verbalized understanding of above. Family is also searching for possible ALF's with Memory Care facilities in both this area and in [place] to be near one of her sons.     This Clinical research associate has reviewed above note and is in agreement with contents and disposition.  Ileene Hutchinson , MSW, LCSWA 07/23/2011 12:07 PM

## 2011-07-23 NOTE — ED Notes (Signed)
Patient has a sitter at bedside.

## 2011-07-24 ENCOUNTER — Other Ambulatory Visit: Payer: Self-pay

## 2011-07-24 ENCOUNTER — Emergency Department (HOSPITAL_COMMUNITY): Payer: Medicare (Managed Care)

## 2011-07-24 DIAGNOSIS — I059 Rheumatic mitral valve disease, unspecified: Secondary | ICD-10-CM

## 2011-07-24 LAB — DIFFERENTIAL
Eosinophils Absolute: 0 10*3/uL (ref 0.0–0.7)
Eosinophils Relative: 0 % (ref 0–5)
Lymphs Abs: 1.4 10*3/uL (ref 0.7–4.0)
Monocytes Absolute: 0.5 10*3/uL (ref 0.1–1.0)
Monocytes Relative: 7 % (ref 3–12)

## 2011-07-24 LAB — URINE MICROSCOPIC-ADD ON

## 2011-07-24 LAB — COMPREHENSIVE METABOLIC PANEL
BUN: 16 mg/dL (ref 6–23)
Calcium: 10.6 mg/dL — ABNORMAL HIGH (ref 8.4–10.5)
Creatinine, Ser: 0.97 mg/dL (ref 0.50–1.10)
GFR calc Af Amer: 58 mL/min — ABNORMAL LOW (ref >90)
GFR calc non Af Amer: 50 mL/min — ABNORMAL LOW (ref >90)
Glucose, Bld: 97 mg/dL (ref 70–99)
Total Protein: 7.7 g/dL (ref 6.0–8.3)

## 2011-07-24 LAB — CBC
HCT: 50.8 % — ABNORMAL HIGH (ref 36.0–46.0)
Hemoglobin: 18.1 g/dL — ABNORMAL HIGH (ref 12.0–15.0)
MCH: 32.8 pg (ref 26.0–34.0)
MCV: 92 fL (ref 78.0–100.0)
Platelets: 163 10*3/uL (ref 150–400)
RBC: 5.52 MIL/uL — ABNORMAL HIGH (ref 3.87–5.11)

## 2011-07-24 LAB — URINALYSIS, ROUTINE W REFLEX MICROSCOPIC
Leukocytes, UA: NEGATIVE
Nitrite: NEGATIVE
Specific Gravity, Urine: 1.022 (ref 1.005–1.030)
Urobilinogen, UA: 1 mg/dL (ref 0.0–1.0)
pH: 6 (ref 5.0–8.0)

## 2011-07-24 LAB — POCT I-STAT TROPONIN I: Troponin i, poc: 0.02 ng/mL (ref 0.00–0.08)

## 2011-07-24 MED ORDER — LORAZEPAM 2 MG/ML IJ SOLN
0.5000 mg | Freq: Once | INTRAMUSCULAR | Status: AC
  Administered 2011-07-27: 0.5 mg via INTRAMUSCULAR
  Filled 2011-07-24: qty 1

## 2011-07-24 NOTE — Progress Notes (Signed)
2D Echo Completed.  Emelia Loron, Sonographer

## 2011-07-24 NOTE — Progress Notes (Signed)
Occupational Therapy Evaluation Patient Details Name: Yvonne Donovan MRN: [id number] DOB: 1920/07/09 Today's Date: 07/24/2011 1117 1136 ev2  Problem List: There is no problem list on file for this patient.   Past Medical History:  Past Medical History  Diagnosis Date  . Hypertension   . Constipation   . Pain   . Acid reflux   . Hypercholesteremia   . Hypothyroid    Past Surgical History:  Past Surgical History  Procedure Date  . Wrist surgery   . Abdominal hysterectomy     OT Assessment/Plan/Recommendation OT Assessment Clinical Impression Statement: pt would benefit from skilled ot secondary to the deficits listed below with supervision to min guard goals in acute OT Recommendation/Assessment: Patient will need skilled OT in the acute care venue OT Problem List: Decreased strength;Impaired balance (sitting and/or standing);Decreased cognition;Decreased safety awareness OT Therapy Diagnosis : Generalized weakness;Cognitive deficits OT Plan OT Frequency: Min 1X/week OT Treatment/Interventions: Self-care/ADL training;Therapeutic activities;Cognitive remediation/compensation;Patient/family education;Balance training OT Recommendation Follow Up Recommendations: Skilled nursing facility;Other (comment) (vs. psych placement, if indicated) Individuals Consulted Consulted and Agree with Results and Recommendations: Patient unable/family or caregiver not available OT Goals Acute Rehab OT Goals OT Goal Formulation: Patient unable to participate in goal setting ADL Goals Pt Will Perform Grooming: with supervision;Standing at sink;Other (comment) (greater than or equal to 1vc) ADL Goal: Grooming - Progress: Progressing toward goals Pt Will Perform Upper Body Bathing: with supervision;Sitting at sink;Unsupported;Other (comment) (with no more than 1 vc for thoroughness/sequencing) ADL Goal: Upper Body Bathing - Progress: Progressing toward goals Pt Will Perform Lower Body Bathing:  with min assist;Sit to stand from chair;with cueing (comment type and amount) (1 vc) ADL Goal: Lower Body Bathing - Progress: Progressing toward goals Pt Will Perform Upper Body Dressing: with supervision;Sitting, chair;Unsupported;with cueing (comment type and amount) (1 vc) ADL Goal: Upper Body Dressing - Progress: Progressing toward goals Pt Will Transfer to Toilet: with min assist;Other (comment);with cueing (comment type and amount) (with min guard and 1 vc) ADL Goal: Toilet Transfer - Progress: Progressing toward goals Pt Will Perform Toileting - Clothing Manipulation: with supervision;Standing;with cueing (comment type and amount) (1 vc) ADL Goal: Toileting - Clothing Manipulation - Progress: Progressing toward goals Pt Will Perform Toileting - Hygiene: with supervision;with cueing (comment type and amount);Sit to stand from 3-in-1/toilet (1 vc) ADL Goal: Toileting - Hygiene - Progress: Progressing toward goals Miscellaneous OT Goals Miscellaneous OT Goal #1: pt will attend to adl task for 2 minutes without cues OT Goal: Miscellaneous Goal #1 - Progress: Progressing toward goals  OT Evaluation Precautions/Restrictions  Precautions Precautions: Fall;Other (comment) Precaution Comments: pt has sitter Restrictions Weight Bearing Restrictions: No Prior Functioning Home Living Lives With: Other (Comment) (per chart lives with family.  Did not ask any PLOF questions) Prior Function Level of Independence: Independent with basic ADLs;Other (comment) (per chart) Comments: did not ask any home questions; doesn't appear home is planned ADL ADL Eating/Feeding: Simulated;Independent Where Assessed - Eating/Feeding: Edge of bed Grooming: Simulated;Set up Where Assessed - Grooming: Sitting, bed;Unsupported Upper Body Bathing: Performed;Supervision/safety Upper Body Bathing Details (indicate cue type and reason): min cues, decreased attention and sequencing Where Assessed - Upper Body  Bathing: Sitting, chair;Other (comment);Unsupported (on toilet) Lower Body Bathing: Performed;Minimal assistance Lower Body Bathing Details (indicate cue type and reason): min cues Where Assessed - Lower Body Bathing: Sitting, chair;Unsupported Upper Body Dressing: Simulated;Set up Where Assessed - Upper Body Dressing: Sitting, chair;Unsupported Lower Body Dressing: Performed;Moderate assistance Lower Body Dressing Details (indicate cue type and reason): mod  cues for attention; cues to pull up underwear, assist to straighten and pull socks up completely and to tie disposable pants Where Assessed - Lower Body Dressing: Sit to stand from chair Toilet Transfer: Performed;Minimal assistance Toilet Transfer Details (indicate cue type and reason): min cues for safety; pt furniture walks--needed balance support once to avoid fall; pt declined rw Toilet Transfer Method: Ambulating Toileting - Clothing Manipulation: Performed;Minimal assistance Where Assessed - Toileting Clothing Manipulation: Standing Toileting - Hygiene: Performed;Set up Where Assessed - Toileting Hygiene: Sit on 3-in-1 or toilet Vision/Perception  Vision - History Baseline Vision: No visual deficits Cognition Cognition Arousal/Alertness: Awake/alert Overall Cognitive Status: Impaired Attention: Impaired Current Attention Level: Sustained Attention - Other Comments: decreased sequencing Safety/Judgement: Decreased safety judgement for tasks assessed Decreased Safety/Judgement: Decreased awareness of need for assistance Problem Solving: Requires assistance for problem solving Sensation/Coordination Coordination Fine Motor Movements are Fluid and Coordinated: Yes Extremity Assessment RUE Assessment RUE Assessment: Within Functional Limits LUE Assessment LUE Assessment: Within Functional Limits Mobility  Bed Mobility Bed Mobility: Yes Supine to Sit: 5: Supervision Transfers Transfers: Yes Sit to Stand: 4: Min assist  (min guard) Exercises   End of Session OT - End of Session Activity Tolerance: Patient tolerated treatment well Patient left: in bed Nurse Communication: Other (comment) (sitter present) General Behavior During Session: [facility] for tasks performed Cognition: Impaired   Yvonne Donovan [id number] 07/24/2011, 1:09 PM

## 2011-07-24 NOTE — Progress Notes (Signed)
Patient discussed with her son Ferne Reus- he is very concerned that patient is refusing all of her medications- he states that patient has an untreated UTI and that her delusions and psychosis are worse. He does not feel he can manage patient at home at this time and wants her treated.  He wants to seek ALF placement in the future. Pt. Discussed with Dr. Clarene Duke regarding above. Currently no psych beds available; still awaiting PT eval for recommendation. Bradly Bienenstock, SW Intern 07/24/2011 2:47 PM  This writer has reviewed above contents and is in agreement with disposition.  Ileene Hutchinson , MSW, LCSWA 07/24/2011 2:47 PM

## 2011-07-24 NOTE — Progress Notes (Signed)
77 yo F, no previous hx dementia or psychiatric hx, has been in the ED approx 94 hours for confusion, agitation, psychosis.  Has been considered for admission at multiple psych facilities, but refused.  Most recent refusal is [facility] due to a mildy elevated pro-BNP (they requested this lab to be performed).  Pt variably cooperative with ED staff, occas yelling and accusing staff of "trying to kill me."  Social Work has eval pt and states she does not meet SNF criteria at this time.  Pt's family refuses to take her home.  Pt does not appear able to care for herself at this time and would be a safety risk if discharged on her own.  [facility] called, long d/w Dr's Hongalgi and Krishnan: they do not feel pt meets criteria for medical admission at this time, suggest to obtain Cardiac Echo to further investigate mild elevation of pro-BNP.  EKG, CXR, labs, UA ordered to be rechecked today for any changes after extensive ED stay.

## 2011-07-24 NOTE — Progress Notes (Signed)
Physical Therapy Evaluation Patient Details Name: Yvonne Donovan MRN: [id number] DOB: 11/16/1919 Today's Date: 07/24/2011 Time: 4098-1191 Charge: EVII  Problem List: There is no problem list on file for this patient.   Past Medical History:  Past Medical History  Diagnosis Date  . Hypertension   . Constipation   . Pain   . Acid reflux   . Hypercholesteremia   . Hypothyroid    Past Surgical History:  Past Surgical History  Procedure Date  . Wrist surgery   . Abdominal hysterectomy     PT Assessment/Plan/Recommendation PT Assessment Clinical Impression Statement: Pt presents with decreased safety awareness, decreased problem solving, and decreased balance.  Pt requiring minA for ambulation with RW for steadying.  Pt would benefit from PT services for gait training, improving safety awareness, and improving balance for safe D/C to next venue. PT Recommendation/Assessment: Patient will need skilled PT in the acute care venue PT Problem List: Decreased strength;Decreased balance;Decreased mobility;Decreased safety awareness;Decreased knowledge of use of DME PT Therapy Diagnosis : Difficulty walking (decreased balance) PT Plan PT Frequency: Min 3X/week PT Treatment/Interventions: DME instruction;Gait training;Functional mobility training;Therapeutic exercise;Balance training;Patient/family education PT Recommendation Follow Up Recommendations: 24 hour supervision/assistance;Skilled nursing facility Equipment Recommended: Rolling walker with 5" wheels PT Goals  Acute Rehab PT Goals PT Goal Formulation: With patient Time For Goal Achievement: 7 days Pt will Transfer Sit to Stand/Stand to Sit: with supervision PT Transfer Goal: Sit to Stand/Stand to Sit - Progress: Progressing toward goal Pt will Ambulate: >150 feet;with supervision;with rolling walker (with no LOB) PT Goal: Ambulate - Progress: Progressing toward goal Pt will Perform Home Exercise Program: with min assist (for  LE strengthening and balance exercises) PT Goal: Perform Home Exercise Program - Progress: Not met  PT Evaluation Precautions/Restrictions  Precautions Precautions: Fall Precaution Comments: sitter per chart but none in room Restrictions Weight Bearing Restrictions: No Prior Functioning  Home Living Lives With:  (family per chart.  pt reports lives with son.) Home Adaptive Equipment: Quad cane Prior Function Level of Independence: Requires assistive device for independence Comments: Pt reports she occasionally uses quad cane but not normally. Cognition Cognition Arousal/Alertness: Awake/alert Overall Cognitive Status: Impaired Attention: Impaired Current Attention Level: Sustained Attention - Other Comments: decreased sequencing Memory: Appears impaired Memory Deficits: Pt unable to recall information explained 4 minutes ago. Orientation Level: Oriented to person;Disoriented to situation;Disoriented to place;Disoriented to time Safety/Judgement: Decreased safety judgement for tasks assessed Decreased Safety/Judgement: Decreased awareness of need for assistance Problem Solving: Requires assistance for problem solving Sensation/Coordination Sensation Light Touch: Appears Intact (hard to assess 2* cognition) Coordination Fine Motor Movements are Fluid and Coordinated: Yes Extremity Assessment RUE Assessment RUE Assessment: Within Functional Limits LUE Assessment LUE Assessment: Within Functional Limits RLE Assessment RLE Assessment: Exceptions to [facility] RLE Strength RLE Overall Strength Comments: grossly 4-/5 per functional observation LLE Assessment LLE Assessment: Exceptions to [facility] LLE Strength LLE Overall Strength Comments: grossly 4-/5 per functional observation Mobility (including Balance) Bed Mobility Bed Mobility: Yes Supine to Sit: 5: Supervision Supine to Sit Details (indicate cue type and reason): increased verbal cues for problem solving to get OOB Sitting -  Scoot to Edge of Bed: 5: Supervision Sitting - Scoot to [place] of Bed Details (indicate cue type and reason): increased verbal cues for scooting hips to EOB Transfers Transfers: Yes Sit to Stand: 4: Min assist;From elevated surface;With upper extremity assist;From bed Sit to Stand Details (indicate cue type and reason): min/guard Stand to Sit: 4: Min assist;With upper extremity assist;To elevated surface;To  bed Stand to Sit Details: min/guard Ambulation/Gait Ambulation/Gait: Yes Ambulation/Gait Assistance: 4: Min assist Ambulation/Gait Assistance Details (indicate cue type and reason): assist for balance, attempted without RW but pt very unsteady and required UEs support, verbal cues for safe use of RW Ambulation Distance (Feet): 52 Feet Assistive device: Rolling walker Gait Pattern: Decreased step length - left;Decreased step length - right  Balance Balance Assessed: Yes Dynamic Standing Balance Dynamic Standing - Level of Assistance: 4: Min assist (minA for balance with ambulation) Exercise    End of Session PT - End of Session Activity Tolerance: Patient tolerated treatment well Patient left: in bed;with call bell in reach General Behavior During Session: [facility] for tasks performed Cognition: Impaired  Mallie Linnemann,[name] 07/24/2011, 3:40 PM

## 2011-07-24 NOTE — ED Notes (Signed)
CSW contacted pt's son Areebah Meinders ([phone]) in order to discuss discharge planning in regards to SNF and ALF placement. Oncoming CSW to follow up with Ron regarding discharge planning and the possibility that pt's insurance may not cover an ALF and that private pay may need to be explored. CSW also contacted [facility] to place the pt on Dr. Tad Moore consult list to follow up with her for a psychiatric assessment to the following day.

## 2011-07-24 NOTE — BH Assessment (Signed)
Assessment Note   Yvonne Donovan is an 77 y.o. female.  Attempted to reassess the patient.  However, pt was undergoing an echocardiogram and was unable to be seen.  However, per ED staff, pt has improved today, and has taken her medication.  Per ED staff, pt continues to be confused and paranoid (stated hears gun shots and wanted to move to a "safe" place, states hearing "Jillyn Hidden").  Pt has been declined at several medical facilities due to medical acuity.  Pt was seen by telepsych, who recommended pt return home.  SW called [facility] and pt is pending a psych consult per Brandy Hale (nurse in CDU).  Completed reassessment and faxed to [facility] to log.  Axis I: Psychotic Disorder NOS Axis II: Deferred Axis III:  Past Medical History  Diagnosis Date  . Hypertension   . Constipation   . Pain   . Acid reflux   . Hypercholesteremia   . Hypothyroid    Axis IV: other psychosocial or environmental problems Axis V: 21-30 behavior considerably influenced by delusions or hallucinations OR serious impairment in judgment, communication OR inability to function in almost all areas  Past Medical History:  Past Medical History  Diagnosis Date  . Hypertension   . Constipation   . Pain   . Acid reflux   . Hypercholesteremia   . Hypothyroid     Past Surgical History  Procedure Date  . Wrist surgery   . Abdominal hysterectomy     Family History: History reviewed. No pertinent family history.  Social History:  reports that she has never smoked. She does not have any smokeless tobacco history on file. She reports that she drinks alcohol. Her drug history not on file.  Allergies: No Known Allergies  Home Medications:  Medications Prior to Admission  Medication Dose Route Frequency Provider Last Rate Last Dose  . acetaminophen (TYLENOL) tablet 650 mg  650 mg Oral Once Flint Melter, MD   650 mg at 07/21/11 0629  . acetaminophen (TYLENOL) tablet 650 mg  650 mg Oral Q4H PRN Laray Anger, DO      . alum & mag hydroxide-simeth (MAALOX/MYLANTA) 200-200-20 MG/5ML suspension 30 mL  30 mL Oral PRN Laray Anger, DO      . aspirin 325 MG tablet        325 mg at 07/21/11 0859  . cephALEXin (KEFLEX) capsule 500 mg  500 mg Oral Once Suzi Roots, MD   500 mg at 07/20/11 1759  . cephALEXin (KEFLEX) capsule 500 mg  500 mg Oral Q6H Laray Anger, DO   500 mg at 07/24/11 1300  . levothyroxine (SYNTHROID, LEVOTHROID) tablet 75 mcg  75 mcg Oral QAC breakfast Gwyneth Sprout, MD   75 mcg at 07/24/11 1156  . LORazepam (ATIVAN) injection 0.5 mg  0.5 mg Intravenous Once Ward Givens, MD   0.5 mg at 07/22/11 0804  . LORazepam (ATIVAN) injection 0.5 mg  0.5 mg Intramuscular Once Laray Anger, DO      . LORazepam (ATIVAN) tablet 0.5 mg  0.5 mg Oral Q8H PRN Laray Anger, DO      . LORazepam (ATIVAN) tablet 1 mg  1 mg Oral Once Laray Anger, DO   1 mg at 07/21/11 1723  . losartan (COZAAR) tablet 50 mg  50 mg Oral Daily Gwyneth Sprout, MD   50 mg at 07/24/11 1157  . ondansetron (ZOFRAN) tablet 4 mg  4 mg Oral Q8H PRN Magnus Sinning  McManus, DO      . risperiDONE (RISPERDAL) tablet 0.5 mg  0.5 mg Oral QHS Gwyneth Sprout, MD      . simvastatin (ZOCOR) tablet 10 mg  10 mg Oral QHS Gwyneth Sprout, MD      . DISCONTD: aspirin EC tablet 325 mg  325 mg Oral Daily Flint Melter, MD      . DISCONTD: LORazepam (ATIVAN) injection 1 mg  1 mg Intravenous Once Ward Givens, MD       No current outpatient prescriptions on file as of 07/20/2011.    OB/GYN Status:  No LMP recorded.  General Assessment Data Assessment Number: 2  Living Arrangements: Children Can pt return to current living arrangement?: Yes Admission Status: Voluntary Is patient capable of signing voluntary admission?: Yes Transfer from: Home Referral Source: Self/Family/Friend  Risk to self Suicidal Ideation: No Suicidal Intent: No Is patient at risk for suicide?: No Suicidal Plan?: No Access to  Means: No What has been your use of drugs/alcohol within the last 12 months?:  (none) Other Self Harm Risks:  (none) Triggers for Past Attempts: Unknown Intentional Self Injurious Behavior: None Factors that decrease suicide risk:  (n/a) Family Suicide History: No Recent stressful life event(s): Other (Comment) (Recently moved to [place] with her son) Persecutory voices/beliefs?: Yes Depression: Yes Depression Symptoms: Insomnia;Tearfulness;Fatigue;Loss of interest in usual pleasures;Feeling worthless/self pity Substance abuse history and/or treatment for substance abuse?: No Suicide prevention information given to non-admitted patients: Not applicable  Risk to Others Homicidal Ideation: No Thoughts of Harm to Others: No Current Homicidal Intent: No Current Homicidal Plan: No Access to Homicidal Means: No Identified Victim:  (n/a) History of harm to others?: No Assessment of Violence: None Noted Violent Behavior Description: n/a Does patient have access to weapons?: No Criminal Charges Pending?: No Does patient have a court date: No  Mental Status Report Appear/Hygiene: Bizarre;Other (Comment) (unable to assess) Eye Contact: Other (Comment) Motor Activity: Unable to assess (unable to assess) Speech: Unable to assess Level of Consciousness: Unable to assess Mood: Other (Comment) (unable to assess) Affect: Unable to Assess Anxiety Level:  (unable to assess) Thought Processes:  (unable to assess) Judgement: Impaired Orientation: Unable to assess Obsessive Compulsive Thoughts/Behaviors:  (unable to assess)  Cognitive Functioning Concentration: Decreased Memory: Recent Impaired;Remote Impaired IQ: Average Insight: Poor Impulse Control: Poor Appetite: Fair Weight Loss:  (n/a) Weight Gain:  (n/a) Sleep: Decreased Total Hours of Sleep:  (3) Vegetative Symptoms: None  Prior Inpatient/Outpatient Therapy Prior Therapy:  (n/a) Prior Therapy Dates: n/a Prior Therapy  Facilty/Provider(s): n/a Reason for Treatment: n/a            Values / Beliefs Cultural Requests During Hospitalization: None Spiritual Requests During Hospitalization: None        Additional Information 1:1 In Past 12 Months?: Yes CIRT Risk: No Elopement Risk: No Does patient have medical clearance?: Yes     Disposition:  Disposition Disposition of Patient: Other dispositions (Pending telepsych) Type of inpatient treatment program:  (n/a) Other disposition(s): Other (Comment) (Penidng telepsych)  On Site Evaluation by:   Reviewed with Physician:  Berlinda Last, Rennis Harding 07/24/2011 4:33 PM

## 2011-07-24 NOTE — Progress Notes (Signed)
Component Value Range   WBC 7.9  4.0 - 10.5 (K/uL)   RBC 5.52 (*) 3.87 - 5.11 (MIL/uL)   Hemoglobin 18.1 (*) 12.0 - 15.0 (g/dL)   HCT 21.3 (*) 08.6 - 46.0 (%)   MCV 92.0  78.0 - 100.0 (fL)   MCH 32.8  26.0 - 34.0 (pg)   MCHC 35.6  30.0 - 36.0 (g/dL)   RDW 57.8  46.9 - 62.9 (%)   Platelets 163  150 - 400 (K/uL)  DIFFERENTIAL      Component Value Range   Neutrophils Relative 75  43 - 77 (%)   Neutro Abs 5.9  1.7 - 7.7 (K/uL)   Lymphocytes Relative 18  12 - 46 (%)   Lymphs Abs 1.4  0.7 - 4.0 (K/uL)   Monocytes Relative 7  3 - 12 (%)   Monocytes Absolute 0.5  0.1 - 1.0 (K/uL)   Eosinophils Relative 0  0 - 5 (%)   Eosinophils Absolute 0.0  0.0 - 0.7 (K/uL)   Basophils Relative 0  0 - 1 (%)   Basophils Absolute 0.0  0.0 - 0.1 (K/uL)  COMPREHENSIVE METABOLIC PANEL      Component Value Range   Sodium 139  135 - 145 (mEq/L)   Potassium 4.1  3.5 - 5.1 (mEq/L)   Chloride 102  96 - 112 (mEq/L)   CO2 25  19 - 32 (mEq/L)   Glucose, Bld 97  70 - 99 (mg/dL)   BUN 16  6 - 23 (mg/dL)   Creatinine, Ser 5.28  0.50 - 1.10 (mg/dL)   Calcium 41.3 (*) 8.4 - 10.5 (mg/dL)   Total Protein 7.7  6.0 - 8.3 (g/dL)   Albumin 4.3  3.5 - 5.2 (g/dL)   AST 23  0 - 37 (U/L)   ALT 15  0 - 35 (U/L)   Alkaline Phosphatase 83  39 - 117 (U/L)   Total Bilirubin 1.2  0.3 - 1.2 (mg/dL)   GFR calc non Af Amer 50 (*) >90 (mL/min)   GFR calc Af Amer 58 (*) >90 (mL/min)  URINALYSIS, ROUTINE W REFLEX MICROSCOPIC      Component Value Range   Color, Urine AMBER (*) YELLOW    Appearance CLOUDY (*) CLEAR    Specific Gravity, Urine 1.022  1.005 - 1.030    pH 6.0  5.0 - 8.0    Glucose, UA NEGATIVE  NEGATIVE (mg/dL)   Hgb urine dipstick TRACE (*) NEGATIVE    Bilirubin Urine SMALL (*) NEGATIVE    Ketones, ur 40 (*) NEGATIVE (mg/dL)   Protein, ur NEGATIVE  NEGATIVE (mg/dL)   Urobilinogen, UA 1.0  0.0 - 1.0 (mg/dL)   Nitrite NEGATIVE  NEGATIVE    Leukocytes, UA NEGATIVE  NEGATIVE   LACTIC ACID, PLASMA      Component  Value Range   Lactic Acid, Venous 1.4  0.5 - 2.2 (mmol/L)  POCT I-STAT TROPONIN I      Component Value Range   Troponin i, poc 0.02  0.00 - 0.08 (ng/mL)   Comment 3           URINE MICROSCOPIC-ADD ON      Component Value Range   Squamous Epithelial / LPF MANY (*) RARE    WBC, UA 0-2  <3 (WBC/hpf)   RBC / HPF 0-2  <3 (RBC/hpf)   Bacteria, UA RARE  RARE    Urine-Other MUCOUS PRESENT     Dg Chest 2 View  07/24/2011  *RADIOLOGY REPORT*  Clinical Data: Confusion.  Altered mental status.  CHEST - 2 VIEW  Comparison: 07/23/2011  Findings: Moderate cardiomegaly is stable.  Moderate hiatal hernia again noted.  Both lungs are clear. Changes of COPD again seen. Old upper thoracic vertebral body compression fracture is unchanged.  Left shoulder prosthesis again seen.  IMPRESSION: Stable cardiomegaly, COPD, and hiatal hernia.  No acute findings.  Original Report Authenticated By: Danae Orleans, M.D.    Date: 07/24/2011  Rate: 80  Rhythm: atrial fibrillation  QRS Axis: right  Intervals: normal  ST/T Wave abnormalities: nonspecific T wave changes  Conduction Disutrbances:none  Narrative Interpretation:   Old EKG Reviewed: unchanged; no significant changes from previous EKG dated 07/21/11.  Pt's Echo completed:  Study Conclusions - Left ventricle: The cavity size was normal. There was mild focal basal hypertrophy of the septum. Systolic function was normal. The estimated ejection fraction was in the range of 55% to 60%. - Aortic valve: Moderately calcified annulus. - Mitral valve: Mild regurgitation. - Left atrium: The atrium was moderately dilated. - Right atrium: The atrium was mildly dilated. - Atrial septum: No defect or patent foramen ovale was identified. - Pericardium, extracardiac: A trivial pericardial effusion was identified. Transthoracic echocardiography. M-mode, complete 2D, spectral Doppler, and color Doppler. Height: Height: 170.2cm. Height: 67in. Weight: Weight: 72.6kg.  Weight: 159.7lb. Body mass index: BMI: 25.1kg/m^2. Body surface area: BSA: 1.71m^2. Blood pressure: 142/88. Patient status: Inpatient. Location: Emergency department.  Long d/w Nursing Director and Social Worker:  Pt does not appear to have criteria to admit to hospital based on dx testing completed today.  Telepsych consult today feels pt is ok for discharge.  Issue is that at this time family refuses to take pt home, and pt does not appear able to take care of herself on her own and is still delusional (ie: "Jillyn Hidden is after me").  Social Worker spoke with [facility], Psych Dr. Rogers Blocker will eval pt tomorrow and assist with decision making (ie: psychosis vs dementia).  Decision to be made after his eval:  Home with family (if family is willing) vs medical hospital admit to SNF (if family continues to refuse to take pt home with them) vs geri-psych placement.

## 2011-07-24 NOTE — ED Notes (Signed)
Patient is resting comfortably.    Requested all her PM meds now because she wanted to retire early.    Continue to monitor.

## 2011-07-24 NOTE — ED Notes (Signed)
RN notified of patient's blood pressure. 

## 2011-07-24 NOTE — ED Notes (Signed)
Assign nurse notified about labs need to be drawn.

## 2011-07-24 NOTE — ED Notes (Signed)
-     1:1 enforced

## 2011-07-25 NOTE — Progress Notes (Signed)
Echo reports were faxed this morning to [facility] along with other labs etc. Echo refaxed to them by RN per their request. Spoke with Dewain Penning at [place]. She stated that she would review with MD and call me back.   Bradly Bienenstock, SW Intern 07/25/2011 4:46 PM

## 2011-07-25 NOTE — ED Notes (Signed)
Call received by [facility] requesting medical clearance for [facility].   Pablo Ledger currently working on transfer paperwork to placement of patient.

## 2011-07-25 NOTE — Progress Notes (Addendum)
Spoke with Yvonne Donovan at [facility] today; faxed updated labs, xrays, echo early this morning for review. She stated that their physician has the papers and is reviewing them but had a meeting today and has not yet notified her regarding status of admission.  Spoke with pt's son Yvonne Donovan 3 as well to keep him updated on above. [facility] request with [facility] is in place; Fl2 updated and will be faxed to SNFs for review in case Yvonne Donovan is unable to accept patient.  Message left for Yvonne Donovan regarding above. Yvonne Donovan, SW Intern 07/25/2011 2:30 PM  Note requires co signing due to intern status. Cosign requested of LCSW- J. Haidinger, LCSW.       Yvonne Donovan, SW Intern.  This Clinical research associate has reviewed contents and disposition and is in agreement with contents.  Yvonne Donovan , MSW, LCSWA 07/28/2011 6:58 AM

## 2011-07-25 NOTE — ED Notes (Signed)
Spoke with Pablo Ledger of CSW Dept who is working on placement. She has contacted [facility] to see if they are reconsidering pt. CSW is completing referrals to Nursing Homes and also scheduled appointment for OPT psychiatric evaluation on 08/26/11 with Dr. Sheela Stack office. ACT will schedule telepsych of pt to have them evaluate for dementia or further recommendations.

## 2011-07-25 NOTE — ED Notes (Signed)
Contacted Child psychotherapist in reference to transfer.  [facility] has been faxed ECHO are requested.  No return phone call from [facility].  Awaiting for contact from receiving MD

## 2011-07-26 ENCOUNTER — Emergency Department (HOSPITAL_COMMUNITY): Payer: Medicare (Managed Care)

## 2011-07-26 ENCOUNTER — Encounter (HOSPITAL_COMMUNITY): Payer: Self-pay | Admitting: Psychiatry

## 2011-07-26 DIAGNOSIS — N390 Urinary tract infection, site not specified: Secondary | ICD-10-CM

## 2011-07-26 DIAGNOSIS — F419 Anxiety disorder, unspecified: Secondary | ICD-10-CM

## 2011-07-26 DIAGNOSIS — F0390 Unspecified dementia without behavioral disturbance: Secondary | ICD-10-CM

## 2011-07-26 DIAGNOSIS — Z590 Homelessness: Secondary | ICD-10-CM | POA: Insufficient documentation

## 2011-07-26 LAB — URINE CULTURE: Culture  Setup Time: 201211160111

## 2011-07-26 NOTE — ED Notes (Signed)
Patient is resting comfortably. 

## 2011-07-26 NOTE — ED Notes (Signed)
Call received from pt's son requesting to speak to the psychiatrist. Son informed that psychiatrist is not available and advised to call back tomorrow AM. Vw, rn.

## 2011-07-26 NOTE — ED Notes (Signed)
Resting quietly at this time. Denies any needs at this time.vw, rn.

## 2011-07-26 NOTE — ED Provider Notes (Addendum)
[facility] apparently reconsidering placement. Requesting repeat BMP, CBC, BNP, EKG, CXR prior to making decision though. If does not accept will get social work consult. Pt had tele-psych eval which apparently did not think needs inpatient psychiatric admission. Family apparently unwilling to take home. Pt has been here multiple days with no disposition. Will try to contact family. Per review of notes and my interaction with pt feel that possibly organic and does not need psychiatric placement.  12:21 AM Message left for pt's son, Santosha Jividen. did not receive call back. [phone]. Apparently other son, Charliegh Vasudevan is out of country and telephone number listed for him is non working. [phone].  Raeford Razor, MD 07/27/11 Rich Fuchs  Raeford Razor, MD 07/27/11 [phone]

## 2011-07-26 NOTE — ED Notes (Signed)
Medicated patient with morning med. Vw, rn.

## 2011-07-26 NOTE — Progress Notes (Signed)
Psychiatric Assessment Adult  Patient Identification:  Yvonne Donovan Date of Evaluation:  07/26/2011 Chief Complaint: anxiety History of Chief Complaint:   Chief Complaint  Patient presents with  . Anxiety    R/O dementia    HPI Review of Systems Physical Exam  Depressive Symptoms: none  (Hypo) Manic Symptoms:   Elevated Mood:  No Irritable Mood:  No Grandiosity:  Yes Distractibility:  Yes Labiality of Mood:  No Delusions:  No Hallucinations:  Yes Impulsivity:  No Sexually Inappropriate Behavior:  No Financial Extravagance:  No Flight of Ideas:  Yes  Anxiety Symptoms: Excessive Worry:  Yes Panic Symptoms:  No Agoraphobia:  No Obsessive Compulsive: No  Symptoms: None Specific Phobias:  No Social Anxiety:  No  Psychotic Symptoms:  Hallucinations: Yes None Delusions:  No Paranoia:  Yes   Ideas of Reference:  No   Past Medical History:   Past Medical History  Diagnosis Date  . Hypertension   . Constipation   . Pain   . Acid reflux   . Hypercholesteremia   . Hypothyroid    History of Loss of Consciousness:  No Seizure History:  No Cardiac History:  Yes Allergies:  No Known Allergies Current Medications:  No current facility-administered medications for this visit.   Current Outpatient Prescriptions  Medication Sig Dispense Refill  . Calcium Carbonate-Vitamin D 600-400 MG-UNIT per tablet Take 1 tablet by mouth daily.        Marland Kitchen docusate sodium (COLACE) 100 MG capsule Take 100 mg by mouth 2 (two) times daily.        Marland Kitchen levothyroxine (SYNTHROID, LEVOTHROID) 75 MCG tablet Take 75 mcg by mouth daily.        Marland Kitchen losartan (COZAAR) 50 MG tablet Take 50 mg by mouth daily.        . Multiple Vitamins-Minerals (EYE VITAMINS PO) Take 1 tablet by mouth daily.        . Multiple Vitamins-Minerals (MULTIVITAMINS THER. W/MINERALS) TABS Take 1 tablet by mouth daily.        . ranitidine (ZANTAC) 150 MG capsule Take 150 mg by mouth daily.       . simvastatin (ZOCOR) 10 MG  tablet Take 10 mg by mouth at bedtime.         Facility-Administered Medications Ordered in Other Visits  Medication Dose Route Frequency Provider Last Rate Last Dose  . acetaminophen (TYLENOL) tablet 650 mg  650 mg Oral Q4H PRN Laray Anger, DO      . alum & mag hydroxide-simeth (MAALOX/MYLANTA) 200-200-20 MG/5ML suspension 30 mL  30 mL Oral PRN Laray Anger, DO      . cephALEXin [facility]) capsule 500 mg  500 mg Oral Q6H Laray Anger, DO   500 mg at 07/26/11 0155  . levothyroxine (SYNTHROID, LEVOTHROID) tablet 75 mcg  75 mcg Oral QAC breakfast Gwyneth Sprout, MD   75 mcg at 07/26/11 0801  . LORazepam (ATIVAN) injection 0.5 mg  0.5 mg Intramuscular Once Laray Anger, DO      . LORazepam (ATIVAN) tablet 0.5 mg  0.5 mg Oral Q8H PRN Laray Anger, DO   0.5 mg at 07/25/11 0915  . losartan (COZAAR) tablet 50 mg  50 mg Oral Daily Gwyneth Sprout, MD   50 mg at 07/25/11 2110  . ondansetron (ZOFRAN) tablet 4 mg  4 mg Oral Q8H PRN Laray Anger, DO      . simvastatin (ZOCOR) tablet 10 mg  10 mg Oral QHS Gwyneth Sprout, MD  10 mg at 07/25/11 2110    Social History: Current Place of Residence: Lives with daughter in law, Clydie Braun Place of Birth: not known Family Members: Rupert Stacks -sons; son and husband are deceased Marital Status:  Widowed Children: 3  Sons: 3  Daughters: 0 Relationships: 0 Education:  not known Educational Problems/Performance: not known Religious Beliefs/Practices:  not known History of Abuse:  not known    Family History:  History reviewed. No pertinent family history.  Mental Status Examination/Evaluation: Objective:  Appearance: Disheveled  Eye Contact::  Fair  Speech:  Slow  Volume:  Decreased  Mood:  confused  Affect:  Congruent  Thought Process:  tangential, coherent history mixed with non-sensical statements  Orientation:  Other:  Oriented to month and year, person onlhy  Thought Content:  Hallucinations:  Auditory Visual  Suicidal Thoughts:  No  Homicidal Thoughts:  No  Judgement:  Impaired  Insight:  Fair  Psychomotor Activity:  Decreased  Akathisia:  No  Handed:  Right  AIMS (if indicated): none  Assets:  Housing Social Support Others:  medical and physical care    Laboratory/X-Ray Psychological Evaluation(s)   Not pertinent See report   Assessment:   Axis I: Early dementia Axis II: Deferred Axis III:  Past Medical History  Diagnosis Date  . Hypertension   . Constipation   . Pain   . Acid reflux   . Hypercholesteremia   . Hypothyroid    Axis IV: economic problems, housing problems, problems related to social environment and problems with primary support group Axis V: 41-50 serious symptoms  Treatment Plan/Recommendations:  Plan of Care: Pt desires to go to a nursing when medically stable from URI  Laboratory:  refer to ED team  Psychotherapy:  unable to process; supportive therapy only  Medications: meds taking  Routine PRN Medications:  Yes  Consultations: given 07/26/11  Safety Concerns: ambulatory assist and protect from falls  Other:      Mickeal Skinner, MD 11/17/201211:06 AM

## 2011-07-26 NOTE — ED Notes (Signed)
Pt's dtr-in-law at bedside visiting patient. Vw, rn.

## 2011-07-26 NOTE — ED Notes (Signed)
Pt assisted with ambulation to the bathroom. Voided unmeasured amt of dark amber urine.  Vw, rn.

## 2011-07-26 NOTE — ED Notes (Signed)
Just met with psychiatrist. Resting quietly in bed watching tv. Vw,rn.

## 2011-07-27 ENCOUNTER — Other Ambulatory Visit: Payer: Self-pay

## 2011-07-27 LAB — BASIC METABOLIC PANEL
BUN: 24 mg/dL — ABNORMAL HIGH (ref 6–23)
Chloride: 100 mEq/L (ref 96–112)
Creatinine, Ser: 0.97 mg/dL (ref 0.50–1.10)
GFR calc Af Amer: 58 mL/min — ABNORMAL LOW (ref >90)
GFR calc non Af Amer: 50 mL/min — ABNORMAL LOW (ref >90)
Glucose, Bld: 156 mg/dL — ABNORMAL HIGH (ref 70–99)
Potassium: 3.9 mEq/L (ref 3.5–5.1)

## 2011-07-27 LAB — CBC
HCT: 47 % — ABNORMAL HIGH (ref 36.0–46.0)
Hemoglobin: 16.4 g/dL — ABNORMAL HIGH (ref 12.0–15.0)
MCH: 32 pg (ref 26.0–34.0)
MCHC: 34.9 g/dL (ref 30.0–36.0)
RBC: 5.12 MIL/uL — ABNORMAL HIGH (ref 3.87–5.11)

## 2011-07-27 LAB — DIFFERENTIAL
Basophils Relative: 1 % (ref 0–1)
Eosinophils Absolute: 0.1 10*3/uL (ref 0.0–0.7)
Lymphs Abs: 2.1 10*3/uL (ref 0.7–4.0)
Monocytes Absolute: 0.4 10*3/uL (ref 0.1–1.0)
Monocytes Relative: 6 % (ref 3–12)

## 2011-07-27 NOTE — ED Notes (Signed)
Patient awake up to BP with assist x 1.  Extremely confused, not aware of surroundings, asking how she got here and are we moving.  Voided peri care done

## 2011-07-27 NOTE — ED Notes (Signed)
Pt has become agitated and experiencing anxiety. Will give ativan as ordered.

## 2011-07-27 NOTE — ED Notes (Signed)
Son, Ron called and asked if his mother had been transferred.  Told him that [facility] Unit had rejected her admission again (for the 3rd time).  Told him that Dr Juleen China attempted to reach him last PM to tell him that he was going to discharge her to home.  He sts that no is at home in [place].  And proceeds to ask why can't she be admitted to a memory unit.  I told him that this is something that needs to be discussed with the MD and the MSW.

## 2011-07-27 NOTE — ED Notes (Signed)
Pt has displayed very high anxiety. Pt cursing and throwing things at staff. Pt has threatened staff with throwing the call bell and other objects. Will administer 0.5mg  IM Ativan as ordered.

## 2011-07-27 NOTE — ED Notes (Signed)
Pt in room, upset about family situation, CNA at bedside w/ pt.

## 2011-07-27 NOTE — ED Notes (Signed)
[name], Child psychotherapist called to see pt, [name] to come see pt.

## 2011-07-27 NOTE — ED Notes (Signed)
Transferring the patient to ER#5 per charge nurses direction

## 2011-07-27 NOTE — ED Notes (Signed)
Patient continues to sleep soundly.

## 2011-07-27 NOTE — ED Notes (Signed)
Dr Juleen China here and questioned about the need to redraw and reXR the patient for [facility].  He sts that this is not necessary and do not wake the patient for this that theses don't need to be repeated.  Dr Juleen China asks for family information to contact them to d/c the patient to home.

## 2011-07-27 NOTE — ED Notes (Signed)
Complete bed bath given to pt. Clean linens applied. Oral care performed. Pt

## 2011-07-28 ENCOUNTER — Emergency Department (HOSPITAL_COMMUNITY): Payer: Medicare (Managed Care)

## 2011-07-28 MED ORDER — TUBERCULIN PPD 5 UNIT/0.1ML ID SOLN
5.0000 [IU] | Freq: Once | INTRADERMAL | Status: DC
  Filled 2011-07-28: qty 0.1

## 2011-07-28 NOTE — ED Notes (Signed)
CSW notified by pt's RN that pt had been declined by [facility]. CSW telephoned [facility] intake coordinator x2 to obtain reason for bed offer decline. Intake coordinator was unavailable each time. CSW telephoned 1st shift CSW to obtain plan of placement for pt. CSW notified MD CSWs are attempting placement with [facility] along with pt's son who is looking into private pay for ALF placement. MD agreeable to disposition plan at this time.

## 2011-07-28 NOTE — ED Notes (Signed)
Bed alarm placed on for safety.  Pt on 1L oxygen per [place].

## 2011-07-28 NOTE — ED Notes (Addendum)
Spoke with SW a/b pt transfer status, pt was accepted to [place] and  plan is for pt to transfer to [facility] after 7pm tonight.  Dellia Cloud, RN

## 2011-07-28 NOTE — ED Notes (Signed)
Pt remains asleep.  Breathing equal and unlabored.  Continue to monitor.

## 2011-07-28 NOTE — ED Provider Notes (Signed)
Patient has been accepted for admission by Dr. Salley Hews at [facility]. She will be transferred in good condition.  Cyndra Numbers, MD 07/28/11 [phone]

## 2011-07-28 NOTE — Progress Notes (Signed)
Patient has been accepted to [facility] by Dr. Lowanda Foster. Patient to be transported after 7pm by sheriff. CSW contacted patient's son and notified him of this. Patient's nurse notified.  Ileene Hutchinson , MSW, LCSWA 07/28/2011 1:41 PM

## 2011-07-28 NOTE — ED Notes (Signed)
Pt in NAD at this time. Eating breakfast in bed. Just took meds.

## 2011-07-28 NOTE — Progress Notes (Signed)
Physical Therapy Treatment Patient Details Name: Yvonne Donovan MRN: [id number] DOB: 1920/07/25 Today's Date: 07/28/2011 Time: 4098-1191 Charge: TA PT Assessment/Plan  PT - Assessment/Plan Comments on Treatment Session: Pt on commode upon entering room and required mod assist for balance to perform hygiene.  Pt declined further ambulation after toileting 2* fatigue.   PT Plan: Discharge plan remains appropriate;Frequency remains appropriate Follow Up Recommendations: Skilled nursing facility Equipment Recommended: Rolling walker with 5" wheels PT Goals  Acute Rehab PT Goals PT Transfer Goal: Sit to Stand/Stand to Sit - Progress: Progressing toward goal PT Goal: Ambulate - Progress: Progressing toward goal  PT Treatment Precautions/Restrictions  Precautions Precautions: Fall Precaution Comments: sitter per chart but none in room Restrictions Weight Bearing Restrictions: No Mobility (including Balance) Bed Mobility Bed Mobility: Yes Sit to Supine - Left: 5: Supervision Sit to Supine - Left Details (indicate cue type and reason): increased time, verbal cues for instruction to bring both LEs onto bed and move to middle of bed Transfers Transfers: Yes Sit to Stand: From toilet;With upper extremity assist;4: Min assist Sit to Stand Details (indicate cue type and reason): assist to rise from commode with grab bar Stand to Sit: 4: Min assist;To bed Stand to Sit Details: assist to control descent, verbal cues to back up to bed and not try to sit while turning toward bed Ambulation/Gait Ambulation/Gait: Yes Ambulation/Gait Assistance: 4: Min assist Ambulation/Gait Assistance Details (indicate cue type and reason): assist for balance, pt unsteady with ambulation, verbal cues for safe use of RW Ambulation Distance (Feet): 8 Feet Assistive device: Rolling walker Gait Pattern: Decreased step length - right;Decreased step length - left (increased hip/knee flexion during stance  bilaterally) Gait velocity: slow cadence  Balance Balance Assessed: Yes Dynamic Standing Balance Dynamic Standing - Balance Support: During functional activity Dynamic Standing - Level of Assistance: 3: Mod assist Dynamic Standing - Comments: Pt attempted to perform hygiene in standing, but requires modA for balance while reaching and unable to complete hygiene without assistance.  Pt minA for balance while holding onto RW in static standing.   Exercise    End of Session PT - End of Session Activity Tolerance: Patient limited by fatigue Patient left: in bed;with call bell in reach;with bed alarm set General Behavior During Session: [facility] for tasks performed Cognition: Impaired, at baseline  Joanna Borawski,[name] 07/28/2011, 2:54 PM Pager: [phone]

## 2011-07-28 NOTE — ED Notes (Signed)
Field seismologist arrived, pt transferred to [place] for in pt psych

## 2011-10-17 ENCOUNTER — Emergency Department (HOSPITAL_COMMUNITY): Payer: Medicare (Managed Care)

## 2011-10-17 ENCOUNTER — Encounter (HOSPITAL_COMMUNITY): Payer: Self-pay

## 2011-10-17 ENCOUNTER — Emergency Department (HOSPITAL_COMMUNITY)
Admission: EM | Admit: 2011-10-17 | Discharge: 2011-10-18 | Disposition: A | Discharge status: Skilled Nursing Facility | Payer: Medicare (Managed Care) | Attending: Emergency Medicine | Admitting: Emergency Medicine

## 2011-10-17 DIAGNOSIS — I10 Essential (primary) hypertension: Secondary | ICD-10-CM | POA: Insufficient documentation

## 2011-10-17 DIAGNOSIS — R42 Dizziness and giddiness: Secondary | ICD-10-CM | POA: Insufficient documentation

## 2011-10-17 DIAGNOSIS — E7800 Pure hypercholesterolemia, unspecified: Secondary | ICD-10-CM | POA: Insufficient documentation

## 2011-10-17 DIAGNOSIS — H538 Other visual disturbances: Secondary | ICD-10-CM | POA: Insufficient documentation

## 2011-10-17 DIAGNOSIS — F0390 Unspecified dementia without behavioral disturbance: Secondary | ICD-10-CM | POA: Insufficient documentation

## 2011-10-17 DIAGNOSIS — G319 Degenerative disease of nervous system, unspecified: Secondary | ICD-10-CM | POA: Insufficient documentation

## 2011-10-17 DIAGNOSIS — E039 Hypothyroidism, unspecified: Secondary | ICD-10-CM | POA: Insufficient documentation

## 2011-10-17 DIAGNOSIS — K219 Gastro-esophageal reflux disease without esophagitis: Secondary | ICD-10-CM | POA: Insufficient documentation

## 2011-10-17 LAB — URINALYSIS, ROUTINE W REFLEX MICROSCOPIC
Glucose, UA: NEGATIVE mg/dL
Ketones, ur: NEGATIVE mg/dL
Protein, ur: NEGATIVE mg/dL
Urobilinogen, UA: 1 mg/dL (ref 0.0–1.0)

## 2011-10-17 LAB — DIFFERENTIAL
Basophils Relative: 1 % (ref 0–1)
Eosinophils Absolute: 0.1 10*3/uL (ref 0.0–0.7)
Eosinophils Relative: 2 % (ref 0–5)
Lymphs Abs: 2 10*3/uL (ref 0.7–4.0)
Monocytes Relative: 8 % (ref 3–12)
Neutrophils Relative %: 56 % (ref 43–77)

## 2011-10-17 LAB — COMPREHENSIVE METABOLIC PANEL
Albumin: 3.8 g/dL (ref 3.5–5.2)
Alkaline Phosphatase: 64 U/L (ref 39–117)
BUN: 17 mg/dL (ref 6–23)
Calcium: 10.3 mg/dL (ref 8.4–10.5)
Creatinine, Ser: 0.94 mg/dL (ref 0.50–1.10)
GFR calc Af Amer: 61 mL/min — ABNORMAL LOW (ref >90)
Glucose, Bld: 105 mg/dL — ABNORMAL HIGH (ref 70–99)
Total Protein: 6.6 g/dL (ref 6.0–8.3)

## 2011-10-17 LAB — PROTIME-INR
INR: 1.09 (ref 0.00–1.49)
Prothrombin Time: 14.3 seconds (ref 11.6–15.2)

## 2011-10-17 LAB — CBC
Hemoglobin: 14 g/dL (ref 12.0–15.0)
MCH: 32 pg (ref 26.0–34.0)
MCHC: 34.7 g/dL (ref 30.0–36.0)
MCV: 92.4 fL (ref 78.0–100.0)
RBC: 4.37 MIL/uL (ref 3.87–5.11)

## 2011-10-17 LAB — APTT: aPTT: 29 seconds (ref 24–37)

## 2011-10-18 LAB — TSH: TSH: 3.38 u[IU]/mL (ref 0.350–4.500)

## 2011-10-18 NOTE — ED Notes (Signed)
Dr.Davidson at bedside talking with pt.

## 2011-10-18 NOTE — ED Notes (Addendum)
Spoke with the RN at [facility]. Read discharge to RN. No questions and concerns. Will continue to monitor. PTAR called for pt to be sent home.

## 2011-10-18 NOTE — ED Provider Notes (Signed)
History     CSN: [id number]  Arrival date & time 10/17/11  2110   First MD Initiated Contact with Patient 10/17/11 2150      Chief Complaint  Patient presents with  . Dizziness    staff at [facility] report onset of dizziness and blurred vision while watching TV this PM; also reports pt is more confused than usual - at baseline, pt is caox4; however, pt presently oriented to person and place only     (Consider location/radiation/quality/duration/timing/severity/associated sxs/prior treatment) HPI Comments: Pt is an 78 year old woman who lives at [facility], an assisted living facility.  She was watching television after supper, and noted blurred vision that would come and go, lasting about an hour altogether, and then the blurred vision went away.  She told staff at her facility, and they sent her to [facility] ED for evaluation.  She had not had episodes like this before.  Her vision is generally good.  There is no history of glaucoma or macular degeneration.  She has had no previous eye surgery.  There is no history of injury.  She does have a history of dementia, and of hypertension, hypothyroidism, high cholesterol.  She takes 81 mg of aspirin per day.  Patient is a 78 y.o. female presenting with eye problem. The history is provided by the patient (Nursing home records.). No language interpreter was used.  Eye Problem  This is a new problem. The current episode started 1 to 2 hours ago. The problem occurs rarely. The problem has been resolved. There is pain in both eyes. There was no injury mechanism. The pain is at a severity of 0/10. The patient is experiencing no pain. There is no history of trauma to the eye. There is no known exposure to pink eye. She does not wear contacts. Associated symptoms include blurred vision. Pertinent negatives include no decreased vision, no double vision, no foreign body sensation, no photophobia and no weakness. She has tried nothing for the symptoms.    Past  Medical History  Diagnosis Date  . Hypertension   . Constipation   . Pain   . Acid reflux   . Hypercholesteremia   . Hypothyroid     Past Surgical History  Procedure Date  . Wrist surgery   . Abdominal hysterectomy     History reviewed. No pertinent family history.  History  Substance Use Topics  . Smoking status: Never Smoker   . Smokeless tobacco: Not on file  . Alcohol Use: Yes    OB History    Grav Para Term Preterm Abortions TAB SAB Ect Mult Living                  Review of Systems  Constitutional: Negative.   HENT: Negative.   Eyes: Positive for blurred vision and visual disturbance. Negative for double vision and photophobia.  Respiratory: Negative.   Cardiovascular: Negative.   Gastrointestinal: Negative.   Genitourinary: Negative.   Musculoskeletal: Negative.   Neurological: Negative.  Negative for weakness and headaches.       Her chief complaint is listed as dizziness, but it is actually blurred vision.  Psychiatric/Behavioral: Negative.     Allergies  Review of patient's allergies indicates no known allergies.  Home Medications   Current Outpatient Rx  Name Route Sig Dispense Refill  . ASPIRIN EC 81 MG PO TBEC Oral Take 81 mg by mouth daily.    Marland Kitchen CALCIUM CARBONATE 600 MG PO TABS Oral Take 600 mg  by mouth daily.    Marland Kitchen VITAMIN D 1000 UNITS PO TABS Oral Take 1,000 Units by mouth daily.    Marland Kitchen CITALOPRAM HYDROBROMIDE 10 MG PO TABS Oral Take 10 mg by mouth daily.    Marland Kitchen DOCUSATE SODIUM 100 MG PO CAPS Oral Take 100 mg by mouth 2 (two) times daily.      Marland Kitchen LAMOTRIGINE 25 MG PO TABS Oral Take 25 mg by mouth 2 (two) times daily.    Marland Kitchen LEVOTHYROXINE SODIUM 75 MCG PO TABS Oral Take 75 mcg by mouth daily.    Marland Kitchen LORAZEPAM 0.5 MG PO TABS Oral Take 0.5 mg by mouth every 8 (eight) hours as needed. For agitation/anxiety/insomia.    Marland Kitchen LOSARTAN POTASSIUM 25 MG PO TABS Oral Take 25 mg by mouth daily.    . ADULT MULTIVITAMIN W/MINERALS CH Oral Take 1 tablet by mouth  daily.    Marland Kitchen PROSIGHT PO TABS Oral Take 1 tablet by mouth daily.    Marland Kitchen RANITIDINE HCL 150 MG PO CAPS Oral Take 150 mg by mouth daily.     Marland Kitchen RISPERIDONE 0.5 MG PO TABS Oral Take 0.5 mg by mouth at bedtime.    Marland Kitchen SIMVASTATIN 10 MG PO TABS Oral Take 10 mg by mouth every morning.     . TRIAMTERENE-HCTZ 37.5-25 MG PO TABS Oral Take 0.5 tablets by mouth daily.      BP 152/123  Pulse 69  Temp(Src) 98.3 F (36.8 C) (Oral)  Resp 18  SpO2 99%  Physical Exam  Constitutional: She is oriented to person, place, and time. She appears well-developed and well-nourished. No distress.       Pleasant elderly woman in no distress.  She wears glasses.  HENT:  Head: Normocephalic and atraumatic.  Right Ear: External ear normal.  Left Ear: External ear normal.  Mouth/Throat: Oropharynx is clear and moist.  Eyes: Conjunctivae and EOM are normal. Pupils are equal, round, and reactive to light.       Corneae clear, anterior chambers normal.  Retinae not well seen due to pupillary miosis, but no apparent retinal detachment.  Neck: Normal range of motion. Neck supple.       No carotid bruit.  Cardiovascular: Normal rate, regular rhythm and normal heart sounds.   Pulmonary/Chest: Effort normal and breath sounds normal.  Abdominal: Soft. Bowel sounds are normal.  Musculoskeletal: Normal range of motion.  Neurological: She is alert and oriented to person, place, and time.       No sensory or motor deficit.  Skin: Skin is warm and dry.  Psychiatric: She has a normal mood and affect. Her behavior is normal.    ED Course  Procedures (including critical care time)  Labs Reviewed  COMPREHENSIVE METABOLIC PANEL - Abnormal; Notable for the following:    Glucose, Bld 105 (*)    GFR calc non Af Amer 52 (*)    GFR calc Af Amer 61 (*)    All other components within normal limits  URINALYSIS, ROUTINE W REFLEX MICROSCOPIC - Abnormal; Notable for the following:    Leukocytes, UA MODERATE (*)    All other components  within normal limits  CBC  DIFFERENTIAL  PROTIME-INR  APTT  URINE MICROSCOPIC-ADD ON   Ct Head Wo Contrast  10/17/2011  *RADIOLOGY REPORT*  Clinical Data: Blurry vision  CT HEAD WITHOUT CONTRAST  Technique:  Contiguous axial images were obtained from the base of the skull through the vertex without contrast.  Comparison: 07/21/2011  Findings: Diffuse cerebral atrophy.  Low attenuation changes in the deep white matter consistent with small vessel ischemia.  Mild ventricular dilatation consistent with central atrophy.  Old lacunar infarcts in the thalami.  No mass effect or midline shift. No abnormal extra-axial fluid collections.  Gray-white matter junctions are distinct.  Basal cisterns are not effaced.  The vascular calcifications.  Visualized paranasal sinuses are not opacified.  No depressed skull fractures.  No significant changes since the previous study.  IMPRESSION: Chronic atrophy and small vessel ischemic changes.  No evidence of acute intracranial hemorrhage, mass lesion, or acute infarct.  Original Report Authenticated By: Marlon Pel, M.D.     1. Blurred vision     1:16 AM Case discussed with Dr. Thana Farr, on call for neurology.  Requested that a TSH be added to her labs.  Advised that she should increase her dose of aspirin to 325 mg per day, and to followup with Dr. Redmond School, her physician at her facility.        Carleene Cooper III, MD 10/18/11 [phone]

## 2011-10-18 NOTE — ED Notes (Signed)
Main lab stated that they will add TSH to blood already drawn. Will continue to monitor.

## 2013-02-01 ENCOUNTER — Emergency Department (HOSPITAL_COMMUNITY): Payer: Medicare Other

## 2013-02-01 ENCOUNTER — Encounter (HOSPITAL_COMMUNITY): Payer: Self-pay | Admitting: *Deleted

## 2013-02-01 ENCOUNTER — Inpatient Hospital Stay (HOSPITAL_COMMUNITY)
Admission: EM | Admit: 2013-02-01 | Discharge: 2013-02-04 | DRG: 178 | Disposition: A | Discharge status: Skilled Nursing Facility | Payer: Medicare Other | Attending: Internal Medicine | Admitting: Internal Medicine

## 2013-02-01 DIAGNOSIS — I509 Heart failure, unspecified: Secondary | ICD-10-CM | POA: Diagnosis present

## 2013-02-01 DIAGNOSIS — E7800 Pure hypercholesterolemia, unspecified: Secondary | ICD-10-CM | POA: Diagnosis present

## 2013-02-01 DIAGNOSIS — I129 Hypertensive chronic kidney disease with stage 1 through stage 4 chronic kidney disease, or unspecified chronic kidney disease: Secondary | ICD-10-CM | POA: Diagnosis present

## 2013-02-01 DIAGNOSIS — Z79899 Other long term (current) drug therapy: Secondary | ICD-10-CM

## 2013-02-01 DIAGNOSIS — N179 Acute kidney failure, unspecified: Secondary | ICD-10-CM | POA: Diagnosis present

## 2013-02-01 DIAGNOSIS — I10 Essential (primary) hypertension: Secondary | ICD-10-CM

## 2013-02-01 DIAGNOSIS — E039 Hypothyroidism, unspecified: Secondary | ICD-10-CM | POA: Diagnosis present

## 2013-02-01 DIAGNOSIS — J189 Pneumonia, unspecified organism: Secondary | ICD-10-CM | POA: Diagnosis present

## 2013-02-01 DIAGNOSIS — J690 Pneumonitis due to inhalation of food and vomit: Principal | ICD-10-CM | POA: Diagnosis present

## 2013-02-01 DIAGNOSIS — E785 Hyperlipidemia, unspecified: Secondary | ICD-10-CM | POA: Diagnosis present

## 2013-02-01 DIAGNOSIS — E860 Dehydration: Secondary | ICD-10-CM | POA: Diagnosis present

## 2013-02-01 DIAGNOSIS — N183 Chronic kidney disease, stage 3 unspecified: Secondary | ICD-10-CM | POA: Diagnosis present

## 2013-02-01 HISTORY — DX: Heart failure, unspecified: I50.9

## 2013-02-01 HISTORY — DX: Hyperlipidemia, unspecified: E78.5

## 2013-02-01 HISTORY — DX: Bronchitis, not specified as acute or chronic: J40

## 2013-02-01 LAB — CBC WITH DIFFERENTIAL/PLATELET
Basophils Absolute: 0 10*3/uL (ref 0.0–0.1)
Basophils Relative: 0 % (ref 0–1)
Eosinophils Absolute: 0.1 10*3/uL (ref 0.0–0.7)
Eosinophils Relative: 1 % (ref 0–5)
Lymphs Abs: 0.4 10*3/uL — ABNORMAL LOW (ref 0.7–4.0)
MCH: 31.3 pg (ref 26.0–34.0)
MCV: 91.4 fL (ref 78.0–100.0)
Neutrophils Relative %: 87 % — ABNORMAL HIGH (ref 43–77)
Platelets: 131 10*3/uL — ABNORMAL LOW (ref 150–400)
RBC: 4.86 MIL/uL (ref 3.87–5.11)
RDW: 13.3 % (ref 11.5–15.5)

## 2013-02-01 LAB — URINALYSIS, ROUTINE W REFLEX MICROSCOPIC
Bilirubin Urine: NEGATIVE
Glucose, UA: NEGATIVE mg/dL
Hgb urine dipstick: NEGATIVE
Ketones, ur: NEGATIVE mg/dL
Nitrite: NEGATIVE
Protein, ur: NEGATIVE mg/dL
Specific Gravity, Urine: 1.015 (ref 1.005–1.030)
Urobilinogen, UA: 1 mg/dL (ref 0.0–1.0)
pH: 5 (ref 5.0–8.0)

## 2013-02-01 LAB — COMPREHENSIVE METABOLIC PANEL WITH GFR
ALT: 286 U/L — ABNORMAL HIGH (ref 0–35)
AST: 302 U/L — ABNORMAL HIGH (ref 0–37)
Albumin: 3.6 g/dL (ref 3.5–5.2)
Alkaline Phosphatase: 209 U/L — ABNORMAL HIGH (ref 39–117)
BUN: 28 mg/dL — ABNORMAL HIGH (ref 6–23)
CO2: 25 meq/L (ref 19–32)
Calcium: 9.9 mg/dL (ref 8.4–10.5)
Chloride: 96 meq/L (ref 96–112)
Creatinine, Ser: 1.5 mg/dL — ABNORMAL HIGH (ref 0.50–1.10)
GFR calc Af Amer: 34 mL/min — ABNORMAL LOW
GFR calc non Af Amer: 29 mL/min — ABNORMAL LOW
Glucose, Bld: 143 mg/dL — ABNORMAL HIGH (ref 70–99)
Potassium: 4.2 meq/L (ref 3.5–5.1)
Sodium: 133 meq/L — ABNORMAL LOW (ref 135–145)
Total Bilirubin: 0.8 mg/dL (ref 0.3–1.2)
Total Protein: 7 g/dL (ref 6.0–8.3)

## 2013-02-01 LAB — URINE MICROSCOPIC-ADD ON

## 2013-02-01 MED ORDER — SODIUM CHLORIDE 0.9 % IV BOLUS (SEPSIS)
1000.0000 mL | Freq: Once | INTRAVENOUS | Status: AC
  Administered 2013-02-01: 1000 mL via INTRAVENOUS

## 2013-02-01 NOTE — ED Notes (Signed)
Per EMS, pt running a low grade fever since earlier today. Pt from [place], they recorded fever as 100.0 F. The NH was out of power when EMS arrived and they told EMS that they did not have a PRN order to treat the fever and no physician on call to order meds.

## 2013-02-01 NOTE — ED Notes (Signed)
ION:[id number]<MW> Expected date:<BR> Expected time:<BR> Means of arrival:<BR> Comments:<BR> Move Hall G to Rm 20

## 2013-02-01 NOTE — ED Provider Notes (Signed)
History     CSN: [id number]  Arrival date & time 02/01/13  4098   First MD Initiated Contact with Patient 02/01/13 1926      Chief Complaint  Patient presents with  . Fever    (Consider location/radiation/quality/duration/timing/severity/associated sxs/prior treatment) Patient is a 79 y.o. female presenting with fever. The history is provided by the patient.  Fever Max temp prior to arrival:  100 Temp source:  Oral Onset quality:  Unable to specify Timing:  Unable to specify Progression:  Unable to specify Chronicity:  New Relieved by:  Nothing Worsened by:  Nothing tried Ineffective treatments:  None tried Associated symptoms: cough   Associated symptoms: no chest pain, no chills, no confusion, no diarrhea, no dysuria, no headaches, no myalgias, no nausea and no vomiting   Associated symptoms comment:  States felt a little lightheaded today but denies falls Risk factors: no sick contacts     Past Medical History  Diagnosis Date  . Hypertension   . Constipation   . Pain   . Acid reflux   . Hypercholesteremia   . Hypothyroid   . CHF (congestive heart failure)   . Bronchitis   . Hyperlipidemia     Past Surgical History  Procedure Laterality Date  . Wrist surgery    . Abdominal hysterectomy      No family history on file.  History  Substance Use Topics  . Smoking status: Former Games developer  . Smokeless tobacco: Not on file  . Alcohol Use: Yes    OB History   Grav Para Term Preterm Abortions TAB SAB Ect Mult Living                  Review of Systems  Constitutional: Positive for fever. Negative for chills.  Respiratory: Positive for cough.   Cardiovascular: Negative for chest pain.  Gastrointestinal: Negative for nausea, vomiting and diarrhea.  Genitourinary: Negative for dysuria.  Musculoskeletal: Negative for myalgias.  Neurological: Negative for headaches.  Psychiatric/Behavioral: Negative for confusion.  All other systems reviewed and are  negative.    Allergies  Review of patient's allergies indicates no known allergies.  Home Medications   Current Outpatient Rx  Name  Route  Sig  Dispense  Refill  . aspirin EC 81 MG tablet   Oral   Take 81 mg by mouth daily.         . calcium carbonate (OS-CAL) 600 MG TABS   Oral   Take 600 mg by mouth daily.         . cholecalciferol (VITAMIN D) 1000 UNITS tablet   Oral   Take 1,000 Units by mouth daily.         . citalopram (CELEXA) 10 MG tablet   Oral   Take 10 mg by mouth daily.         Marland Kitchen docusate sodium (COLACE) 100 MG capsule   Oral   Take 100 mg by mouth 2 (two) times daily.           Marland Kitchen lamoTRIgine (LAMICTAL) 25 MG tablet   Oral   Take 25 mg by mouth 2 (two) times daily.         Marland Kitchen levothyroxine (SYNTHROID, LEVOTHROID) 75 MCG tablet   Oral   Take 75 mcg by mouth daily.         Marland Kitchen LORazepam (ATIVAN) 0.5 MG tablet   Oral   Take 0.5 mg by mouth every 8 (eight) hours as needed. For agitation/anxiety/insomia.         Marland Kitchen  losartan (COZAAR) 25 MG tablet   Oral   Take 25 mg by mouth daily.         . Multiple Vitamin (MULITIVITAMIN WITH MINERALS) TABS   Oral   Take 1 tablet by mouth daily.         . multivitamin (PROSIGHT) TABS   Oral   Take 1 tablet by mouth daily.         . ranitidine (ZANTAC) 150 MG capsule   Oral   Take 150 mg by mouth daily.          . risperiDONE (RISPERDAL) 0.5 MG tablet   Oral   Take 0.5 mg by mouth at bedtime.         . simvastatin (ZOCOR) 10 MG tablet   Oral   Take 10 mg by mouth every morning.          . triamterene-hydrochlorothiazide (MAXZIDE-25) 37.5-25 MG per tablet   Oral   Take 0.5 tablets by mouth daily.           BP 142/68  Pulse 97  Temp(Src) 99 F (37.2 C) (Oral)  Resp 18  SpO2 92%  Physical Exam  Nursing note and vitals reviewed. Constitutional: She is oriented to person, place, and time. She appears well-developed and well-nourished. No distress.  HENT:  Head:  Normocephalic and atraumatic.  Mouth/Throat: Oropharynx is clear and moist.  Eyes: Conjunctivae and EOM are normal. Pupils are equal, round, and reactive to light.  Neck: Normal range of motion. Neck supple.  Cardiovascular: Normal rate, regular rhythm and intact distal pulses.   No murmur heard. Pulmonary/Chest: Effort normal. Not tachypneic. No respiratory distress. She has no decreased breath sounds. She has no wheezes. She has rhonchi in the right lower field. She has no rales.  Abdominal: Soft. She exhibits no distension. There is no tenderness. There is no rebound and no guarding.  Musculoskeletal: Normal range of motion. She exhibits no edema and no tenderness.  Neurological: She is alert and oriented to person, place, and time.  Skin: Skin is warm and dry. No rash noted. No erythema.  Psychiatric: She has a normal mood and affect. Her behavior is normal.    ED Course  Procedures (including critical care time)  Labs Reviewed  CBC WITH DIFFERENTIAL - Abnormal; Notable for the following:    Hemoglobin 15.2 (*)    Platelets 131 (*)    Neutrophils Relative % 87 (*)    Lymphocytes Relative 6 (*)    Lymphs Abs 0.4 (*)    All other components within normal limits  COMPREHENSIVE METABOLIC PANEL - Abnormal; Notable for the following:    Sodium 133 (*)    Glucose, Bld 143 (*)    BUN 28 (*)    Creatinine, Ser 1.50 (*)    AST 302 (*)    ALT 286 (*)    Alkaline Phosphatase 209 (*)    GFR calc non Af Amer 29 (*)    GFR calc Af Amer 34 (*)    All other components within normal limits  URINALYSIS, ROUTINE W REFLEX MICROSCOPIC - Abnormal; Notable for the following:    Color, Urine AMBER (*)    Leukocytes, UA TRACE (*)    All other components within normal limits  URINE MICROSCOPIC-ADD ON   Dg Chest 2 View  02/01/2013   *RADIOLOGY REPORT*  Clinical Data: Cough and fever.  CHEST - 2 VIEW  Comparison: Chest x-ray 07/28/2011.  Findings: Evaluation of the left base is poor  secondary to  superimposed large hiatal hernia, however, there appears to be some potential air space consolidation in the left lower lobe which may represent sequelae of aspiration or early changes related to pneumonia.  Probable small left pleural effusion.  Right lung appears clear.  Scattered small calcified granulomas.  No evidence of pulmonary edema.  Heart size appears mildly enlarged (unchanged).  Extensive atherosclerosis of the thoracic aorta. Upper mediastinal contours are otherwise within normal limits. Postoperative changes of left shoulder hemiarthroplasty are noted.  IMPRESSION: 1.  Potential early airspace consolidation in the left lower lobe which may represent sequelae of aspiration or developing infection. There is also likely a small left pleural effusion. 2.  Large hiatal hernia. 3.  Extensive atherosclerosis.   Original Report Authenticated By: Trudie Reed, M.D.     No diagnosis found.    MDM   Patient states today she was feeling a little dizzy which she describes as lightheaded but denies any appetite changes, unsteadiness on her feet, dysuria or pain. She does admit to having a cough for the last week that's nonproductive. She denies shortness of breath. At the home where she lives she was febrile to 100. She is otherwise stable here but mildly hypoxic at 92% on room air.  CBC, CMP, UA, chest x-ray pending  8:40 PM CXR with findings of early CAP.  CBC within normal limits and CMP with elevated LFTs AST of 300 and ALT of near 300. Creatinine doubled to 1.5 and urine negative for infection. On reevaluation and palpation in the right upper quadrant patient now complains of pain.  She still has her gallbladder will do an ultrasound to ensure that does not cause of her elevated LFTs. She does not take Tylenol.  Patient given IV fluids  12:09 AM U/S still pending.  Covered with azithro/rocephin for CAP.  Pt checked out to Dr. Adriana Simas.    Gwyneth Sprout, MD 02/02/13 0010

## 2013-02-01 NOTE — ED Notes (Signed)
US at bedside

## 2013-02-02 ENCOUNTER — Encounter (HOSPITAL_COMMUNITY): Payer: Self-pay | Admitting: Internal Medicine

## 2013-02-02 DIAGNOSIS — J189 Pneumonia, unspecified organism: Secondary | ICD-10-CM

## 2013-02-02 DIAGNOSIS — E860 Dehydration: Secondary | ICD-10-CM | POA: Diagnosis present

## 2013-02-02 DIAGNOSIS — I509 Heart failure, unspecified: Secondary | ICD-10-CM

## 2013-02-02 DIAGNOSIS — R7989 Other specified abnormal findings of blood chemistry: Secondary | ICD-10-CM

## 2013-02-02 DIAGNOSIS — E039 Hypothyroidism, unspecified: Secondary | ICD-10-CM | POA: Diagnosis present

## 2013-02-02 DIAGNOSIS — E7800 Pure hypercholesterolemia, unspecified: Secondary | ICD-10-CM | POA: Diagnosis present

## 2013-02-02 DIAGNOSIS — I10 Essential (primary) hypertension: Secondary | ICD-10-CM | POA: Diagnosis present

## 2013-02-02 LAB — ACETAMINOPHEN LEVEL: Acetaminophen (Tylenol), Serum: <15 ug/mL (ref 10–30)

## 2013-02-02 LAB — CBC
Platelets: 116 10*3/uL — ABNORMAL LOW (ref 150–400)
RBC: 4.42 MIL/uL (ref 3.87–5.11)
WBC: 4.6 10*3/uL (ref 4.0–10.5)

## 2013-02-02 LAB — FERRITIN: Ferritin: 275 ng/mL (ref 10–291)

## 2013-02-02 LAB — CREATININE, SERUM
Creatinine, Ser: 1.41 mg/dL — ABNORMAL HIGH (ref 0.50–1.10)
GFR calc Af Amer: 37 mL/min — ABNORMAL LOW (ref >90)

## 2013-02-02 MED ORDER — DEXTROSE 5 % IV SOLN: 500.0000 mg | INTRAVENOUS | Status: DC

## 2013-02-02 MED ORDER — LOSARTAN POTASSIUM 50 MG PO TABS
50.0000 mg | ORAL_TABLET | Freq: Every day | ORAL | Status: DC
  Administered 2013-02-02: 50 mg via ORAL
  Filled 2013-02-02: qty 1

## 2013-02-02 MED ORDER — CALCIUM CARBONATE 1250 (500 CA) MG PO TABS
1250.0000 mg | ORAL_TABLET | Freq: Every day | ORAL | Status: DC
  Administered 2013-02-02 – 2013-02-04 (×3): 1250 mg via ORAL
  Filled 2013-02-02 (×3): qty 1

## 2013-02-02 MED ORDER — CITALOPRAM HYDROBROMIDE 10 MG PO TABS
10.0000 mg | ORAL_TABLET | Freq: Every day | ORAL | Status: DC
  Administered 2013-02-02 – 2013-02-04 (×3): 10 mg via ORAL
  Filled 2013-02-02 (×3): qty 1

## 2013-02-02 MED ORDER — VITAMIN D3 25 MCG (1000 UNIT) PO TABS
1000.0000 [IU] | ORAL_TABLET | Freq: Every day | ORAL | Status: DC
  Administered 2013-02-02 – 2013-02-04 (×3): 1000 [IU] via ORAL
  Filled 2013-02-02 (×3): qty 1

## 2013-02-02 MED ORDER — SODIUM CHLORIDE 0.9 % IV SOLN
INTRAVENOUS | Status: DC
  Administered 2013-02-02: 05:00:00 via INTRAVENOUS
  Administered 2013-02-03: 50 mL/h via INTRAVENOUS

## 2013-02-02 MED ORDER — LAMOTRIGINE 25 MG PO TABS
25.0000 mg | ORAL_TABLET | Freq: Two times a day (BID) | ORAL | Status: DC
  Administered 2013-02-02 – 2013-02-04 (×4): 25 mg via ORAL
  Filled 2013-02-02 (×6): qty 1

## 2013-02-02 MED ORDER — PROSIGHT PO TABS
1.0000 | ORAL_TABLET | Freq: Every day | ORAL | Status: DC
  Administered 2013-02-02 – 2013-02-04 (×3): 1 via ORAL
  Filled 2013-02-02 (×3): qty 1

## 2013-02-02 MED ORDER — AZITHROMYCIN 500 MG PO TABS
500.0000 mg | ORAL_TABLET | Freq: Every day | ORAL | Status: DC
  Administered 2013-02-03 – 2013-02-04 (×2): 500 mg via ORAL
  Filled 2013-02-02 (×2): qty 1

## 2013-02-02 MED ORDER — DOCUSATE SODIUM 100 MG PO CAPS
100.0000 mg | ORAL_CAPSULE | Freq: Two times a day (BID) | ORAL | Status: DC
  Administered 2013-02-02 – 2013-02-04 (×4): 100 mg via ORAL
  Filled 2013-02-02 (×6): qty 1

## 2013-02-02 MED ORDER — FAMOTIDINE 10 MG PO TABS
10.0000 mg | ORAL_TABLET | Freq: Every day | ORAL | Status: DC
  Administered 2013-02-02 – 2013-02-04 (×3): 10 mg via ORAL
  Filled 2013-02-02 (×3): qty 1

## 2013-02-02 MED ORDER — CEFTRIAXONE SODIUM 1 G IJ SOLR
1.0000 g | INTRAMUSCULAR | Status: DC
  Filled 2013-02-02: qty 10

## 2013-02-02 MED ORDER — AZITHROMYCIN 250 MG PO TABS
500.0000 mg | ORAL_TABLET | Freq: Once | ORAL | Status: AC
  Administered 2013-02-02: 500 mg via ORAL
  Filled 2013-02-02: qty 2

## 2013-02-02 MED ORDER — PIPERACILLIN-TAZOBACTAM 3.375 G IVPB
3.3750 g | Freq: Three times a day (TID) | INTRAVENOUS | Status: DC
  Administered 2013-02-02 – 2013-02-04 (×6): 3.375 g via INTRAVENOUS
  Filled 2013-02-02 (×8): qty 50

## 2013-02-02 MED ORDER — ASPIRIN EC 81 MG PO TBEC
81.0000 mg | DELAYED_RELEASE_TABLET | Freq: Every day | ORAL | Status: DC
  Administered 2013-02-02 – 2013-02-04 (×3): 81 mg via ORAL
  Filled 2013-02-02 (×3): qty 1

## 2013-02-02 MED ORDER — ONDANSETRON HCL 4 MG PO TABS: 4.0000 mg | ORAL_TABLET | Freq: Four times a day (QID) | ORAL | Status: DC | PRN

## 2013-02-02 MED ORDER — RISPERIDONE 0.5 MG PO TABS
0.5000 mg | ORAL_TABLET | Freq: Every day | ORAL | Status: DC
  Administered 2013-02-02: 0.5 mg via ORAL
  Filled 2013-02-02 (×3): qty 1

## 2013-02-02 MED ORDER — HEPARIN SODIUM (PORCINE) 5000 UNIT/ML IJ SOLN
5000.0000 [IU] | Freq: Three times a day (TID) | INTRAMUSCULAR | Status: DC
  Administered 2013-02-02 – 2013-02-03 (×4): 5000 [IU] via SUBCUTANEOUS
  Filled 2013-02-02 (×11): qty 1

## 2013-02-02 MED ORDER — DOCUSATE SODIUM 100 MG PO CAPS
100.0000 mg | ORAL_CAPSULE | Freq: Two times a day (BID) | ORAL | Status: DC
  Filled 2013-02-02 (×2): qty 1

## 2013-02-02 MED ORDER — LEVOTHYROXINE SODIUM 75 MCG PO TABS
75.0000 ug | ORAL_TABLET | Freq: Every day | ORAL | Status: DC
  Administered 2013-02-02 – 2013-02-04 (×3): 75 ug via ORAL
  Filled 2013-02-02 (×4): qty 1

## 2013-02-02 MED ORDER — ONDANSETRON HCL 4 MG/2ML IJ SOLN: 4.0000 mg | Freq: Four times a day (QID) | INTRAMUSCULAR | Status: DC | PRN

## 2013-02-02 MED ORDER — DEXTROSE 5 % IV SOLN
1.0000 g | Freq: Once | INTRAVENOUS | Status: AC
  Administered 2013-02-02: 1 g via INTRAVENOUS
  Filled 2013-02-02: qty 10

## 2013-02-02 NOTE — Progress Notes (Signed)
[facility] PROGRESS NOTE  Yvonne Donovan AVW:[id number] DOB: [birth year] DOA: 02/01/2013 PCP: No primary provider on file.  Assessment/Plan: 1. Aspiration PNA; Will change ceftriaxone to zosyn. Continue with dysphagia diet 3. Afebrile. Azithromycin to cover for atypical.  2. Transaminases: unclear etiology. Abdominal US show Normal gallbladder. Common bile duct is upper limits of normal. Check tylenol level. Hepatitis panel pending. Will ask pharmacy to review medications. Repeat LFT in am. Holding statin.  3. Acute on CKD stage 3:  Continue with IV fluids. Repeat renal function in am. Cr peak to 1.5 on admission.   Code Status: Full  Family Communication: none at bedside.  Disposition Plan: SNF when stable.    Consultants:  none  Procedures:  Abdominal US:   Antibiotics:  Ceftriaxone one dose.   Zithromax 5-28  Zosyn 5-28  HPI/Subjective: Feeling well, no cough or SOB.  Had BM today.   Objective: Filed Vitals:   02/02/13 0147 02/02/13 0230 02/02/13 0330 02/02/13 1342  BP:  103/48 122/57 109/66  Pulse:  78 68 73  Temp: 97.8 F (36.6 C)   98.2 F (36.8 C)  TempSrc: Oral   Oral  Resp:   16 18  SpO2:  95% 96% 94%    Intake/Output Summary (Last 24 hours) at 02/02/13 1423 Last data filed at 02/02/13 1343  Gross per 24 hour  Intake    360 ml  Output    350 ml  Net     10 ml   There were no vitals filed for this visit.  Exam:   General:  No distress.   Cardiovascular: S 1, S 2 RRR  Respiratory: crackles bases.   Abdomen: Bs present, soft, NT  Musculoskeletal: no edema.   Data Reviewed: Basic Metabolic Panel:  Recent Labs Lab 02/01/13 1948 02/02/13 0525  NA 133*  --   K 4.2  --   CL 96  --   CO2 25  --   GLUCOSE 143*  --   BUN 28*  --   CREATININE 1.50* 1.41*  CALCIUM 9.9  --    Liver Function Tests:  Recent Labs Lab 02/01/13 1948  AST 302*  ALT 286*  ALKPHOS 209*  BILITOT 0.8  PROT 7.0  ALBUMIN 3.6   No results found for  this basename: LIPASE, AMYLASE,  in the last 168 hours No results found for this basename: AMMONIA,  in the last 168 hours CBC:  Recent Labs Lab 02/01/13 1948 02/02/13 0525  WBC 6.3 4.6  NEUTROABS 5.5  --   HGB 15.2* 13.2  HCT 44.4 40.6  MCV 91.4 91.9  PLT 131* 116*   Cardiac Enzymes: No results found for this basename: CKTOTAL, CKMB, CKMBINDEX, TROPONINI,  in the last 168 hours BNP (last 3 results) No results found for this basename: PROBNP,  in the last 8760 hours CBG: No results found for this basename: GLUCAP,  in the last 168 hours  No results found for this or any previous visit (from the past 240 hour(s)).   Studies: Dg Chest 2 View  02/01/2013   *RADIOLOGY REPORT*  Clinical Data: Cough and fever.  CHEST - 2 VIEW  Comparison: Chest x-ray 07/28/2011.  Findings: Evaluation of the left base is poor secondary to superimposed large hiatal hernia, however, there appears to be some potential air space consolidation in the left lower lobe which may represent sequelae of aspiration or early changes related to pneumonia.  Probable small left pleural effusion.  Right lung appears clear.  Scattered small  calcified granulomas.  No evidence of pulmonary edema.  Heart size appears mildly enlarged (unchanged).  Extensive atherosclerosis of the thoracic aorta. Upper mediastinal contours are otherwise within normal limits. Postoperative changes of left shoulder hemiarthroplasty are noted.  IMPRESSION: 1.  Potential early airspace consolidation in the left lower lobe which may represent sequelae of aspiration or developing infection. There is also likely a small left pleural effusion. 2.  Large hiatal hernia. 3.  Extensive atherosclerosis.   Original Report Authenticated By: Trudie Reed, M.D.   US Abdomen Complete  02/02/2013   *RADIOLOGY REPORT*  Clinical Data:  Fever, hysterectomy  COMPLETE ABDOMINAL ULTRASOUND  Comparison:  None.  Findings:  Gallbladder:  No gallstones, gallbladder wall  thickening, or pericholecystic fluid.  Common bile duct:  Upper lungs are normal as 6 mm.  Liver:  No focal lesion identified.  Within normal limits in parenchymal echogenicity.  IVC:  Appears normal.  Pancreas:  No focal abnormality seen.  Spleen:  Normal sized echogenicity.  Right Kidney:  10.9cm in length.  No evidence of hydronephrosis or stones.  Left Kidney:  10.7cm in length.  There is anechoic cyst measuring 2.5 cm.  Abdominal aorta:  No aneurysm identified.  IMPRESSION:  1.  Normal gallbladder. 2.  Common bile duct is upper limits of normal.   Original Report Authenticated By: Genevive Bi, M.D.    Scheduled Meds: . aspirin EC  81 mg Oral Daily  . [START ON 02/03/2013] azithromycin  500 mg Intravenous Q24H  . calcium carbonate  1,250 mg Oral Daily  . cholecalciferol  1,000 Units Oral Daily  . citalopram  10 mg Oral Daily  . docusate sodium  100 mg Oral BID  . famotidine  10 mg Oral Daily  . heparin  5,000 Units Subcutaneous Q8H  . lamoTRIgine  25 mg Oral BID  . levothyroxine  75 mcg Oral Daily  . multivitamin  1 tablet Oral Daily  . piperacillin-tazobactam (ZOSYN)  IV  3.375 g Intravenous Q8H  . risperiDONE  0.5 mg Oral QHS   Continuous Infusions: . sodium chloride 50 mL/hr at 02/02/13 0434    Principal Problem:   CAP (community acquired pneumonia) Active Problems:   Dehydration, mild   Elevated LFTs   HTN (hypertension)   Hypothyroidism   CHF (congestive heart failure)   Hypercholesterolemia    Time spent: 25 minutes,     Angie Hogg  [facility] Pager [phone]. If 7PM-7AM, please contact night-coverage at [email or web address], password [facility] 02/02/2013, 2:23 PM  LOS: 1 day

## 2013-02-02 NOTE — Progress Notes (Signed)
Clinical Social Work Department BRIEF PSYCHOSOCIAL ASSESSMENT 02/02/2013  Patient:  Yvonne Donovan, Yvonne Donovan     Account Number:  [id number]     Admit date:  02/01/2013  Clinical Social Worker:  Dennison Bulla  Date/Time:  02/02/2013 10:20 AM  Referred by:  Physician  Date Referred:  02/02/2013 Referred for  ALF Placement   Other Referral:   Interview type:  Patient Other interview type:    PSYCHOSOCIAL DATA Living Status:  FACILITY Admitted from facility:  [facility] Level of care:  Assisted Living Primary support name:  Ron Primary support relationship to patient:  CHILD, ADULT Degree of support available:   Strong    CURRENT CONCERNS Current Concerns  Post-Acute Placement   Other Concerns:    SOCIAL WORK ASSESSMENT / PLAN CSW received referral due to patient being admitted from a facility. CSW reviewed chart which stated that patient is from [facility]. CSW attempted to meet with patient but patient confused. CSW completed assessment with son via phone.    CSW introduced myself and explained role. Patient has been living at [facility] (ALF) for almost two years. Son reports that patient was admitted to [facility] about 2 years ago and stayed there for 3 weeks. Son reports that patient needed to be stabilized due to dementia problems. Son felt that [facility] was effective and reports that they adjusted her medications to where she was oriented and engaged throughout the day. Son reports that ALF recently adjusted medications and feels that confusion might be related to medication changes. Son agreeable to patient returning to ALF. CSW explained PT/OT to work with patient to determine if a higher level of care would be needed. Patient was ambulatory at ALF prior to admission and did not use any equipment.    CSW spoke with Anette Riedel at ALF regarding patient's progress and ALF is agreeable to admission when medically stable. CSW completed FL2 and will continue to follow.    Assessment/plan status:  Psychosocial Support/Ongoing Assessment of Needs Other assessment/ plan:   Information/referral to community resources:   Will return to ALF    PATIENT'S/FAMILY'S RESPONSE TO PLAN OF CARE: Patient unable to participate in assessment. Son engaged throughout assessment and thanked CSW for call. Son has CSW contact information if further needs arise.

## 2013-02-02 NOTE — Evaluation (Signed)
Physical Therapy Evaluation Patient Details Name: Yvonne Donovan MRN: [id number] DOB: April 17, 1920 Today's Date: 02/02/2013 Time: 4696-2952 PT Time Calculation (min): 13 min  PT Assessment / Plan / Recommendation Clinical Impression  Pt is a 79 year old female admitted for pneumonia from ALF.  Pt would benefit from acute PT services in order to improve independence with transfers and ambulation by increasing overall strength and activity tolerance.  Pt states she would like to return home.  Recommend 24/7 supervision initially and HHPT.    PT Assessment  Patient needs continued PT services    Follow Up Recommendations  Home health PT;Supervision/Assistance - 24 hour    Does the patient have the potential to tolerate intense rehabilitation      Barriers to Discharge        Equipment Recommendations  None recommended by PT    Recommendations for Other Services     Frequency Min 3X/week    Precautions / Restrictions Precautions Precautions: Fall   Pertinent Vitals/Pain n/a     Mobility  Bed Mobility Bed Mobility: Supine to Sit;Sit to Supine Supine to Sit: 5: Supervision Sit to Supine: 5: Supervision Transfers Transfers: Sit to Stand;Stand to Sit Sit to Stand: 4: Min guard;From bed;With upper extremity assist Stand to Sit: 4: Min guard;To bed;With upper extremity assist Details for Transfer Assistance: increased time to rise, min/guard for safety Ambulation/Gait Ambulation/Gait Assistance: 4: Min guard Ambulation Distance (Feet): 100 Feet Assistive device: Other (Comment) Ambulation/Gait Assistance Details: pt pushed IV pole for more support, min/guard for safety, pt unaware of BM upon return to room so pt sat on BSC then performed hygiene and washed hands with supervision Gait Pattern: Step-through pattern;Trunk flexed;Decreased stride length Gait velocity: decreased General Gait Details: pt reports "shaky LEs" and fatigues quickly    Exercises     PT Diagnosis:  Difficulty walking;Generalized weakness  PT Problem List: Decreased strength;Decreased activity tolerance;Decreased mobility;Decreased knowledge of use of DME PT Treatment Interventions: DME instruction;Gait training;Functional mobility training;Therapeutic activities;Therapeutic exercise;Patient/family education   PT Goals Acute Rehab PT Goals PT Goal Formulation: With patient Time For Goal Achievement: 02/09/13 Potential to Achieve Goals: Good Pt will go Sit to Stand: with modified independence PT Goal: Sit to Stand - Progress: Goal set today Pt will go Stand to Sit: with modified independence PT Goal: Stand to Sit - Progress: Goal set today Pt will Ambulate: >150 feet;with modified independence;with least restrictive assistive device PT Goal: Ambulate - Progress: Goal set today Pt will Perform Home Exercise Program: with supervision, verbal cues required/provided PT Goal: Perform Home Exercise Program - Progress: Goal set today  Visit Information  Last PT Received On: 02/02/13 Assistance Needed: +1    Subjective Data  Subjective: My 2 sons live closeby.   Prior Functioning  Home Living Lives With: Alone Type of Home: Assisted living Home Adaptive Equipment: Walker - rolling;Straight cane Prior Function Level of Independence: Independent Comments: Pt states she doesn't live in a facility and does not require assist, stated yes to "community" type living Communication Communication: No difficulties    Cognition  Cognition Arousal/Alertness: Awake/alert Behavior During Therapy: WFL for tasks assessed/performed Overall Cognitive Status: Impaired/Different from baseline Area of Impairment: Orientation Orientation Level: Disoriented to;Time    Extremity/Trunk Assessment Right Upper Extremity Assessment RUE ROM/Strength/Tone: [facility] for tasks assessed Left Upper Extremity Assessment LUE ROM/Strength/Tone: WFL for tasks assessed Right Lower Extremity Assessment RLE  ROM/Strength/Tone: [facility] for tasks assessed Left Lower Extremity Assessment LLE ROM/Strength/Tone: [facility] for tasks assessed   Balance  End of Session PT - End of Session Activity Tolerance: Patient limited by fatigue Patient left: in bed;with call bell/phone within reach;with bed alarm set  GP     [name],[name] 02/02/2013, 2:41 PM [name], PT, DPT 02/02/2013 Pager: [phone]

## 2013-02-02 NOTE — H&P (Signed)
[facility] History and Physical  Yvonne Donovan ZOX:[id number] DOB: [birth year]    PCP:   No primary provider on file.   Chief Complaint: low grade fever.  HPI: Yvonne Donovan is an 79 y.o. female with hx of hypothyroidism, HTN, hx of CHF, hyperlipidemia, presents to the ER with a low grade temperature of 100.  She denied any coughs, abdominal cramps or pain, nausea or vomiting.  She has no shortness of breath or chest pain.  Evaluation in the ER included a CXR which showed an infiltrate LLL, ? Infiltrate vs aspiration, normal WBC and HB, but slightly elevated Cr to 1.5, and significant elevation of her LFTs in the 300's range.  A follow up US showed no biliary dilatation, and she has no pain in her RUQ, nausea, or vomiting.  Hospalist was asked to admit her for PNA.    Rewiew of Systems:  Constitutional: Negative for malaise,  and chills. No significant weight loss or weight gain Eyes: Negative for eye pain, redness and discharge, diplopia, visual changes, or flashes of light. ENMT: Negative for ear pain, hoarseness, nasal congestion, sinus pressure and sore throat. No headaches; tinnitus, drooling, or problem swallowing. Cardiovascular: Negative for chest pain, palpitations, diaphoresis, dyspnea and peripheral edema. ; No orthopnea, PND Respiratory: Negative for cough, hemoptysis, wheezing and stridor. No pleuritic chestpain. Gastrointestinal: Negative for nausea, vomiting, diarrhea, constipation, abdominal pain, melena, blood in stool, hematemesis, jaundice and rectal bleeding.    Genitourinary: Negative for frequency, dysuria, incontinence,flank pain and hematuria; Musculoskeletal: Negative for back pain and neck pain. Negative for swelling and trauma.;  Skin: . Negative for pruritus, rash, abrasions, bruising and skin lesion.; ulcerations Neuro: Negative for headache, lightheadedness and neck stiffness. Negative for weakness, altered level of consciousness , altered mental status,  extremity weakness, burning feet, involuntary movement, seizure and syncope.  Psych: negative for anxiety, depression, insomnia, tearfulness, panic attacks, hallucinations, paranoia, suicidal or homicidal ideation    Past Medical History  Diagnosis Date  . Hypertension   . Constipation   . Pain   . Acid reflux   . Hypercholesteremia   . Hypothyroid   . CHF (congestive heart failure)   . Bronchitis   . Hyperlipidemia     Past Surgical History  Procedure Laterality Date  . Wrist surgery    . Abdominal hysterectomy      Medications:  HOME MEDS: Prior to Admission medications   Medication Sig Start Date End Date Taking? Authorizing Provider  calcium carbonate (OS-CAL) 600 MG TABS Take 600 mg by mouth daily.   Yes Historical Provider, MD  citalopram (CELEXA) 10 MG tablet Take 10 mg by mouth daily.   Yes Historical Provider, MD  docusate sodium (COLACE) 100 MG capsule Take 100 mg by mouth 2 (two) times daily.     Yes Historical Provider, MD  lamoTRIgine (LAMICTAL) 25 MG tablet Take 25 mg by mouth 2 (two) times daily.   Yes Historical Provider, MD  levothyroxine (SYNTHROID, LEVOTHROID) 75 MCG tablet Take 75 mcg by mouth daily.   Yes Historical Provider, MD  LORazepam (ATIVAN) 0.5 MG tablet Take 0.5 mg by mouth every 8 (eight) hours as needed. For agitation/anxiety/insomia.   Yes Historical Provider, MD  losartan (COZAAR) 25 MG tablet Take 50 mg by mouth daily.    Yes Historical Provider, MD  Multiple Vitamin (MULITIVITAMIN WITH MINERALS) TABS Take 1 tablet by mouth daily.   Yes Historical Provider, MD  multivitamin (PROSIGHT) TABS Take 1 tablet by mouth daily.   Yes Historical  Provider, MD  ranitidine (ZANTAC) 150 MG capsule Take 150 mg by mouth daily.    Yes Historical Provider, MD  risperiDONE (RISPERDAL) 0.5 MG tablet Take 0.5 mg by mouth at bedtime.   Yes Historical Provider, MD  simvastatin (ZOCOR) 10 MG tablet Take 10 mg by mouth every morning.    Yes Historical Provider, MD   aspirin EC 81 MG tablet Take 81 mg by mouth daily.    Historical Provider, MD  cholecalciferol (VITAMIN D) 1000 UNITS tablet Take 1,000 Units by mouth daily.    Historical Provider, MD  triamterene-hydrochlorothiazide (MAXZIDE-25) 37.5-25 MG per tablet Take 0.5 tablets by mouth daily.    Historical Provider, MD     Allergies:  No Known Allergies  Social History:   reports that she has quit smoking. She does not have any smokeless tobacco history on file. She reports that  drinks alcohol. She reports that she does not use illicit drugs.  Family History: No family history on file.   Physical Exam: Filed Vitals:   02/02/13 0051 02/02/13 0147 02/02/13 0230 02/02/13 0330  BP: 153/59  103/48 122/57  Pulse: 67  78 68  Temp:  97.8 F (36.6 C)    TempSrc:  Oral    Resp: 18   16  SpO2: 96%  95% 96%   Blood pressure 122/57, pulse 68, temperature 97.8 F (36.6 C), temperature source Oral, resp. rate 16, SpO2 96.00%.  GEN:  Pleasant  patient lying in the stretcher in no acute distress; cooperative with exam. PSYCH:  alert and oriented x4; does not appear anxious or depressed; affect is appropriate. HEENT: Mucous membranes pink and anicteric; PERRLA; EOM intact; no cervical lymphadenopathy nor thyromegaly or carotid bruit; no JVD; There were no stridor. Neck is very supple. Breasts:: Not examined CHEST WALL: No tenderness CHEST: Normal respiration, clear to auscultation bilaterally.  HEART: Regular rate and rhythm.  There are no murmur, rub, or gallops.   BACK: No kyphosis or scoliosis; no CVA tenderness ABDOMEN: soft and non-tender; no masses, no organomegaly, normal abdominal bowel sounds; no pannus; no intertriginous candida. There is no rebound and no distention. Rectal Exam: Not done EXTREMITIES: No bone or joint deformity; age-appropriate arthropathy of the hands and knees; no edema; no ulcerations.  There is no calf tenderness. Genitalia: not examined PULSES: 2+ and  symmetric SKIN: Normal hydration no rash or ulceration CNS: Cranial nerves 2-12 grossly intact no focal lateralizing neurologic deficit.  Speech is fluent; uvula elevated with phonation, facial symmetry and tongue midline. DTR are normal bilaterally, cerebella exam is intact, barbinski is negative and strengths are equaled bilaterally.  No sensory loss.   Labs on Admission:  Basic Metabolic Panel:  Recent Labs Lab 02/01/13 1948  NA 133*  K 4.2  CL 96  CO2 25  GLUCOSE 143*  BUN 28*  CREATININE 1.50*  CALCIUM 9.9   Liver Function Tests:  Recent Labs Lab 02/01/13 1948  AST 302*  ALT 286*  ALKPHOS 209*  BILITOT 0.8  PROT 7.0  ALBUMIN 3.6   No results found for this basename: LIPASE, AMYLASE,  in the last 168 hours No results found for this basename: AMMONIA,  in the last 168 hours CBC:  Recent Labs Lab 02/01/13 1948  WBC 6.3  NEUTROABS 5.5  HGB 15.2*  HCT 44.4  MCV 91.4  PLT 131*   Cardiac Enzymes: No results found for this basename: CKTOTAL, CKMB, CKMBINDEX, TROPONINI,  in the last 168 hours  CBG: No results found for  this basename: GLUCAP,  in the last 168 hours   Radiological Exams on Admission: Dg Chest 2 View  02/01/2013   *RADIOLOGY REPORT*  Clinical Data: Cough and fever.  CHEST - 2 VIEW  Comparison: Chest x-ray 07/28/2011.  Findings: Evaluation of the left base is poor secondary to superimposed large hiatal hernia, however, there appears to be some potential air space consolidation in the left lower lobe which may represent sequelae of aspiration or early changes related to pneumonia.  Probable small left pleural effusion.  Right lung appears clear.  Scattered small calcified granulomas.  No evidence of pulmonary edema.  Heart size appears mildly enlarged (unchanged).  Extensive atherosclerosis of the thoracic aorta. Upper mediastinal contours are otherwise within normal limits. Postoperative changes of left shoulder hemiarthroplasty are noted.  IMPRESSION:  1.  Potential early airspace consolidation in the left lower lobe which may represent sequelae of aspiration or developing infection. There is also likely a small left pleural effusion. 2.  Large hiatal hernia. 3.  Extensive atherosclerosis.   Original Report Authenticated By: Trudie Reed, M.D.   US Abdomen Complete  02/02/2013   *RADIOLOGY REPORT*  Clinical Data:  Fever, hysterectomy  COMPLETE ABDOMINAL ULTRASOUND  Comparison:  None.  Findings:  Gallbladder:  No gallstones, gallbladder wall thickening, or pericholecystic fluid.  Common bile duct:  Upper lungs are normal as 6 mm.  Liver:  No focal lesion identified.  Within normal limits in parenchymal echogenicity.  IVC:  Appears normal.  Pancreas:  No focal abnormality seen.  Spleen:  Normal sized echogenicity.  Right Kidney:  10.9cm in length.  No evidence of hydronephrosis or stones.  Left Kidney:  10.7cm in length.  There is anechoic cyst measuring 2.5 cm.  Abdominal aorta:  No aneurysm identified.  IMPRESSION:  1.  Normal gallbladder. 2.  Common bile duct is upper limits of normal.   Original Report Authenticated By: Genevive Bi, M.D.   Assessment/Plan Present on Admission:  . CAP (community acquired pneumonia) . Dehydration, mild . Elevated LFTs . HTN (hypertension) . Hypothyroidism . Hypercholesterolemia  PLAN: Will admit her and start Rocephin and ZItromax for presumed CAP.  She has significant elevation of her LFTs, and I am not sure the exact etiology.  Will obtain acute hepatitis panel, ferritin, and stop her statin.  Please consult GI in the morning for this moderate degree of liver function test elevation.  For her hypothyrodism, will check her TSH.  She also has mild elevation of her Cr, and will receive IVF.  Will be careful as she has had hx of CHF.   She is stable, full code (confirmed), and will be admitted to [facility] service.  Thank you for allowing me to partake in the care of this nice patient.     Other plans as per  orders.  Code Status: FULL Unk Lightning, MD. [facility] Pager [phone] 7pm to 7am.  02/02/2013, 5:02 AM

## 2013-02-02 NOTE — Progress Notes (Signed)
ANTIBIOTIC CONSULT NOTE - INITIAL  Pharmacy Consult for Zosyn Indication: Aspiration pneumonia  No Known Allergies  Patient Measurements:   Vital Signs: Temp: 98.2 F (36.8 C) (05/28 1342) Temp src: Oral (05/28 1342) BP: 109/66 mmHg (05/28 1342) Pulse Rate: 73 (05/28 1342) Intake/Output from previous day: 05/27 0701 - 05/28 0700 In: -  Out: 50 [Urine:50] Intake/Output from this shift: Total I/O In: 360 [P.O.:360] Out: 300 [Urine:300]  Labs:  Recent Labs  02/01/13 1948 02/02/13 0525  WBC 6.3 4.6  HGB 15.2* 13.2  PLT 131* 116*  CREATININE 1.50* 1.41*   CrCl ~30 ml/min/1.20m2 (normalized)  Microbiology: No results found for this or any previous visit (from the past 720 hour(s)).  Medical History: Past Medical History  Diagnosis Date  . Hypertension   . Constipation   . Pain   . Acid reflux   . Hypercholesteremia   . Hypothyroid   . CHF (congestive heart failure)   . Bronchitis   . Hyperlipidemia     Medications:  Scheduled:  . aspirin EC  81 mg Oral Daily  . [START ON 02/03/2013] azithromycin  500 mg Intravenous Q24H  . calcium carbonate  1,250 mg Oral Daily  . cholecalciferol  1,000 Units Oral Daily  . citalopram  10 mg Oral Daily  . docusate sodium  100 mg Oral BID  . famotidine  10 mg Oral Daily  . heparin  5,000 Units Subcutaneous Q8H  . lamoTRIgine  25 mg Oral BID  . levothyroxine  75 mcg Oral Daily  . multivitamin  1 tablet Oral Daily  . risperiDONE  0.5 mg Oral QHS   Infusions:  . sodium chloride 50 mL/hr at 02/02/13 0434   Assessment: 89 yof admitted 5/27 with PNA, started on Roceph/Zithro, but MD mentions possible aspiration. Spoke with MD will change to Zosyn.  Goal of Therapy:  Dose per renal function  Plan:  Zosyn 3.375gm IV q8h (4hr extended infusions) Follow up renal function & cultures  Loralee Pacas, PharmD, BCPS Pager: [phone]  02/02/2013,1:46 PM

## 2013-02-03 DIAGNOSIS — I10 Essential (primary) hypertension: Secondary | ICD-10-CM

## 2013-02-03 LAB — COMPREHENSIVE METABOLIC PANEL
AST: 107 U/L — ABNORMAL HIGH (ref 0–37)
Albumin: 2.8 g/dL — ABNORMAL LOW (ref 3.5–5.2)
CO2: 25 mEq/L (ref 19–32)
Calcium: 9.1 mg/dL (ref 8.4–10.5)
Creatinine, Ser: 1.11 mg/dL — ABNORMAL HIGH (ref 0.50–1.10)
GFR calc non Af Amer: 42 mL/min — ABNORMAL LOW (ref >90)

## 2013-02-03 LAB — HEPATITIS PANEL, ACUTE
Hep A IgM: NEGATIVE
Hepatitis B Surface Ag: NEGATIVE

## 2013-02-03 NOTE — Progress Notes (Signed)
OT Cancellation Note  Patient Details Name: Yvonne Donovan MRN: [id number] DOB: 1920-04-30   Cancelled Treatment:    Reason Eval/Treat Not Completed: Other (comment)  Pt states that she is not interested in having OT.  She lives at ALF, [facility] [address], and says that staff can assist her as needed.  She was mod I with adls prior to admission.  Note that PT is recommending initial 24/7 supervision.  She was still having difficulty maneuvering RW earlier today with them.Signing off at patient's request.     Linna Thebeau 02/03/2013, 2:06 PM Marica Otter, OTR/L [phone] 02/03/2013

## 2013-02-03 NOTE — Progress Notes (Signed)
Physical Therapy Treatment Patient Details Name: Yvonne Donovan MRN: [id number] DOB: April 16, 1920 Today's Date: 02/03/2013 Time: 7829-5621 PT Time Calculation (min): 33 min  PT Assessment / Plan / Recommendation Comments on Treatment Session  Assisted pt OOB to amb to BR to void then amb in hallway.  Pt agreed to use RW for increased stability however pt struggled to maneuver correctly.    Follow Up Recommendations  Home health PT;Supervision/Assistance - 24 hour     Does the patient have the potential to tolerate intense rehabilitation     Barriers to Discharge        Equipment Recommendations  None recommended by PT    Recommendations for Other Services    Frequency Min 3X/week   Plan      Precautions / Restrictions     Pertinent Vitals/Pain No c/o pain    Mobility  Bed Mobility Bed Mobility: Supine to Sit Supine to Sit: 5: Supervision Details for Bed Mobility Assistance: increased time Transfers Transfers: Sit to Stand;Stand to Sit Sit to Stand: 4: Min guard;From bed;With upper extremity assist;From toilet Stand to Sit: 4: Min guard;With upper extremity assist;To chair/3-in-1;To toilet Details for Transfer Assistance: increased time to rise, min/guard for safety Ambulation/Gait Ambulation/Gait Assistance: 4: Min guard Ambulation Distance (Feet): 185 Feet Assistive device: Rolling walker Ambulation/Gait Assistance Details: Pt agreed to use a RW for increased staedyness however struggled to maneuver it correctly around obsticles. Required increased time and VC's. Gait Pattern: Step-through pattern;Trunk flexed;Decreased stride length Gait velocity: decreased    PT Goals                                               progressing    Visit Information  Last PT Received On: 02/03/13 Assistance Needed: +1    Subjective Data      Cognition    good   Balance   fair-  End of Session PT - End of Session Equipment Utilized During Treatment: Gait belt Activity  Tolerance: Patient tolerated treatment well Patient left: in chair;with call bell/phone within reach   Felecia Shelling  PTA [facility]  Acute  Rehab Pager      [phone]

## 2013-02-03 NOTE — Progress Notes (Signed)
[facility] PROGRESS NOTE  Yvonne Donovan ZOX:[id number] DOB: [birth year] DOA: 02/01/2013 PCP: No primary provider on file.  Assessment/Plan: 1. Aspiration PNA; Will change ceftriaxone to zosyn. Continue with dysphagia diet 3. Afebrile. Azithromycin to cover for atypical. Day 2 of zosyn. Plan to discharge on Augmentin. 2. Transaminases: unclear etiology. Abdominal US show Normal gallbladder. Common bile duct is upper limits of normal.  tylenol level less than 15. Hepatitis B and C negative. LFT trending down. Needs to follow up with PCP for further evaluation. Holding statin.  3. Acute on CKD stage 3:  Continue with IV fluids. Repeat renal function in am. Cr peak to 1.5 on admission. Cr decrease to 1.1.  Code Status: Full  Family Communication: none at bedside.  Disposition Plan: plan to discharge 5-30   Consultants:  none  Procedures:  Abdominal US:   Antibiotics:  Ceftriaxone one dose.   Zithromax 5-28  Zosyn 5-28  HPI/Subjective: Feeling well, no cough or SOB.    Objective: Filed Vitals:   02/02/13 0330 02/02/13 1342 02/02/13 2121 02/03/13 0516  BP: 122/57 109/66 129/77 114/71  Pulse: 68 73 99 95  Temp:  98.2 F (36.8 C) 99.4 F (37.4 C) 98.4 F (36.9 C)  TempSrc:  Oral Oral Oral  Resp: 16 18 16 18   SpO2: 96% 94% 91% 91%    Intake/Output Summary (Last 24 hours) at 02/03/13 1217 Last data filed at 02/03/13 0600  Gross per 24 hour  Intake   1500 ml  Output    900 ml  Net    600 ml   There were no vitals filed for this visit.  Exam:   General:  No distress.   Cardiovascular: S 1, S 2 RRR  Respiratory: crackles bases.   Abdomen: Bs present, soft, NT  Musculoskeletal: no edema.   Data Reviewed: Basic Metabolic Panel:  Recent Labs Lab 02/01/13 1948 02/02/13 0525 02/03/13 0500  NA 133*  --  135  K 4.2  --  3.8  CL 96  --  101  CO2 25  --  25  GLUCOSE 143*  --  115*  BUN 28*  --  19  CREATININE 1.50* 1.41* 1.11*  CALCIUM 9.9  --   9.1   Liver Function Tests:  Recent Labs Lab 02/01/13 1948 02/03/13 0500  AST 302* 107*  ALT 286* 177*  ALKPHOS 209* 201*  BILITOT 0.8 0.7  PROT 7.0 5.7*  ALBUMIN 3.6 2.8*   No results found for this basename: LIPASE, AMYLASE,  in the last 168 hours No results found for this basename: AMMONIA,  in the last 168 hours CBC:  Recent Labs Lab 02/01/13 1948 02/02/13 0525  WBC 6.3 4.6  NEUTROABS 5.5  --   HGB 15.2* 13.2  HCT 44.4 40.6  MCV 91.4 91.9  PLT 131* 116*   Cardiac Enzymes: No results found for this basename: CKTOTAL, CKMB, CKMBINDEX, TROPONINI,  in the last 168 hours BNP (last 3 results) No results found for this basename: PROBNP,  in the last 8760 hours CBG: No results found for this basename: GLUCAP,  in the last 168 hours  No results found for this or any previous visit (from the past 240 hour(s)).   Studies: Dg Chest 2 View  02/01/2013   *RADIOLOGY REPORT*  Clinical Data: Cough and fever.  CHEST - 2 VIEW  Comparison: Chest x-ray 07/28/2011.  Findings: Evaluation of the left base is poor secondary to superimposed large hiatal hernia, however, there appears to be some potential  air space consolidation in the left lower lobe which may represent sequelae of aspiration or early changes related to pneumonia.  Probable small left pleural effusion.  Right lung appears clear.  Scattered small calcified granulomas.  No evidence of pulmonary edema.  Heart size appears mildly enlarged (unchanged).  Extensive atherosclerosis of the thoracic aorta. Upper mediastinal contours are otherwise within normal limits. Postoperative changes of left shoulder hemiarthroplasty are noted.  IMPRESSION: 1.  Potential early airspace consolidation in the left lower lobe which may represent sequelae of aspiration or developing infection. There is also likely a small left pleural effusion. 2.  Large hiatal hernia. 3.  Extensive atherosclerosis.   Original Report Authenticated By: Trudie Reed, M.D.    US Abdomen Complete  02/02/2013   *RADIOLOGY REPORT*  Clinical Data:  Fever, hysterectomy  COMPLETE ABDOMINAL ULTRASOUND  Comparison:  None.  Findings:  Gallbladder:  No gallstones, gallbladder wall thickening, or pericholecystic fluid.  Common bile duct:  Upper lungs are normal as 6 mm.  Liver:  No focal lesion identified.  Within normal limits in parenchymal echogenicity.  IVC:  Appears normal.  Pancreas:  No focal abnormality seen.  Spleen:  Normal sized echogenicity.  Right Kidney:  10.9cm in length.  No evidence of hydronephrosis or stones.  Left Kidney:  10.7cm in length.  There is anechoic cyst measuring 2.5 cm.  Abdominal aorta:  No aneurysm identified.  IMPRESSION:  1.  Normal gallbladder. 2.  Common bile duct is upper limits of normal.   Original Report Authenticated By: Genevive Bi, M.D.    Scheduled Meds: . aspirin EC  81 mg Oral Daily  . azithromycin  500 mg Oral Daily  . calcium carbonate  1,250 mg Oral Daily  . cholecalciferol  1,000 Units Oral Daily  . citalopram  10 mg Oral Daily  . docusate sodium  100 mg Oral BID  . famotidine  10 mg Oral Daily  . heparin  5,000 Units Subcutaneous Q8H  . lamoTRIgine  25 mg Oral BID  . levothyroxine  75 mcg Oral Daily  . multivitamin  1 tablet Oral Daily  . piperacillin-tazobactam (ZOSYN)  IV  3.375 g Intravenous Q8H  . risperiDONE  0.5 mg Oral QHS   Continuous Infusions: . sodium chloride 50 mL/hr at 02/02/13 0434    Principal Problem:   CAP (community acquired pneumonia) Active Problems:   Dehydration, mild   Elevated LFTs   HTN (hypertension)   Hypothyroidism   CHF (congestive heart failure)   Hypercholesterolemia    Time spent: 25 minutes,     Yvonne Donovan  [facility] Pager [phone]. If 7PM-7AM, please contact night-coverage at [email or web address], password [facility] 02/03/2013, 12:17 PM  LOS: 2 days

## 2013-02-04 MED ORDER — CITALOPRAM HYDROBROMIDE 10 MG PO TABS: 10.0000 mg | ORAL_TABLET | Freq: Every day | ORAL | Status: AC

## 2013-02-04 MED ORDER — CALCIUM CARBONATE 1250 (500 CA) MG PO TABS: 1250.0000 mg | ORAL_TABLET | Freq: Every day | ORAL | Status: DC

## 2013-02-04 MED ORDER — AMOXICILLIN-POT CLAVULANATE 875-125 MG PO TABS: 1.0000 | ORAL_TABLET | Freq: Two times a day (BID) | ORAL | Status: DC

## 2013-02-04 MED ORDER — VITAMIN D 1000 UNITS PO TABS: 1000.0000 [IU] | ORAL_TABLET | Freq: Every day | ORAL | Status: DC

## 2013-02-04 MED ORDER — RANITIDINE HCL 150 MG PO CAPS: 150.0000 mg | ORAL_CAPSULE | Freq: Every day | ORAL | Status: AC

## 2013-02-04 MED ORDER — AZITHROMYCIN 500 MG PO TABS: 500.0000 mg | ORAL_TABLET | Freq: Every day | ORAL | Status: DC

## 2013-02-04 NOTE — Discharge Summary (Signed)
Physician Discharge Summary  Yvonne Donovan ZOX:[id number] DOB: 06-22-1920 DOA: 02/01/2013  PCP: No primary provider on file.  Admit date: 02/01/2013 Discharge date: 02/04/2013  Time spent: 35 minutes  Recommendations for Outpatient Follow-up:  1. Consider resume triamterene/HCTZ if renal function stable.  2. Need repeat LFT, if continue to be elevated, please refer o Gastroenterologist.   Discharge Diagnoses:    CAP (community acquired pneumonia), Aspiration PNA.   Acute on CKD stage 3   Dehydration, mild   Elevated LFTs   HTN (hypertension)   Hypothyroidism   CHF (congestive heart failure)   Hypercholesterolemia   Discharge Condition: Stable  Diet recommendation: Heart Healthy.   Filed Weights   02/03/13 0930  Weight: 61.689 kg (136 lb)    History of present illness:  Yvonne Donovan is an 79 y.o. female with hx of hypothyroidism, HTN, hx of CHF, hyperlipidemia, presents to the ER with a low grade temperature of 100. She denied any coughs, abdominal cramps or pain, nausea or vomiting. She has no shortness of breath or chest pain. Evaluation in the ER included a CXR which showed an infiltrate LLL, ? Infiltrate vs aspiration, normal WBC and HB, but slightly elevated Cr to 1.5, and significant elevation of her LFTs in the 300's range. A follow up US showed no biliary dilatation, and she has no pain in her RUQ, nausea, or vomiting. Hospalist was asked to admit her for [facility]   Hospital Course:  Patient was admitted with fever, found to have aspiration PNA by chest x ray. She was treated with IV zosyn for 2 days. She has received azithromycin to cover for atypical bacteria. She will be prescribe 5 more days of Augmentin. She was found to have increase LFT, hepatitis panel negative. Abdominal US negative. Statin was discontinue. She was found to have acute on chronic renal failure. Diuretic were discontinue. Renal function has improved.   Aspiration PNA; Will change ceftriaxone to zosyn.  Continue with dysphagia diet 3. Afebrile. Azithromycin to cover for atypical. Day 2 of zosyn. Plan to discharge on Augmentin today.  Transaminases: unclear etiology. Abdominal US show Normal gallbladder. Common bile duct is upper limits of normal. tylenol level less than 15. Hepatitis A, B and C negative. LFT trending down. Needs to follow up with PCP for further evaluation. Holding statin.  Acute on CKD stage 3: Continue with IV fluids. Repeat renal function in am. Cr peak to 1.5 on admission. Cr decrease to 1.1.   Procedures:  none  Consultations:  none  Discharge Exam: Filed Vitals:   02/03/13 1333 02/03/13 1415 02/03/13 2200 02/04/13 0600  BP: 111/66  107/67 112/70  Pulse: 84  82 73  Temp:  98.3 F (36.8 C) 97.7 F (36.5 C) 98.8 F (37.1 C)  TempSrc:  Axillary Oral Oral  Resp: 18  18 18   Height:      Weight:      SpO2: 96%  91% 92%    General: no distress.  Cardiovascular: S 1, S 2 RRR Respiratory: CTA  Discharge Instructions  Discharge Orders   Future Orders Complete By Expires     Diet - low sodium heart healthy  As directed     Increase activity slowly  As directed         Medication List    STOP taking these medications       lisinopril 5 MG tablet  Commonly known as:  PRINIVIL,ZESTRIL     triamterene-hydrochlorothiazide 37.5-25 MG per tablet  Commonly known as:  MAXZIDE-25      TAKE these medications       amoxicillin-clavulanate 875-125 MG per tablet  Commonly known as:  AUGMENTIN  Take 1 tablet by mouth 2 (two) times daily.     aspirin EC 81 MG tablet  Take 81 mg by mouth daily.     azithromycin 500 MG tablet  Commonly known as:  ZITHROMAX  Take 1 tablet (500 mg total) by mouth daily.     calcium carbonate 1250 MG tablet  Commonly known as:  OS-CAL - dosed in mg of elemental calcium  Take 1 tablet (1,250 mg total) by mouth daily.     cholecalciferol 1000 UNITS tablet  Commonly known as:  VITAMIN D  Take 1 tablet (1,000 Units total) by  mouth daily.     citalopram 10 MG tablet  Commonly known as:  CELEXA  Take 1 tablet (10 mg total) by mouth daily.     docusate sodium 100 MG capsule  Commonly known as:  COLACE  Take 100 mg by mouth 2 (two) times daily.     hydrALAZINE 25 MG tablet  Commonly known as:  APRESOLINE  Take 25 mg by mouth 3 (three) times daily.     lamoTRIgine 25 MG tablet  Commonly known as:  LAMICTAL  Take 25 mg by mouth 2 (two) times daily.     levothyroxine 75 MCG tablet  Commonly known as:  SYNTHROID, LEVOTHROID  Take 75 mcg by mouth daily.     multivitamin Tabs  Take 1 tablet by mouth daily.     multivitamin with minerals Tabs  Take 1 tablet by mouth daily.     ranitidine 150 MG capsule  Commonly known as:  ZANTAC  Take 1 capsule (150 mg total) by mouth daily.     risperiDONE 0.5 MG tablet  Commonly known as:  RISPERDAL  Take 0.5 mg by mouth at bedtime.     simvastatin 10 MG tablet  Commonly known as:  ZOCOR  Take 10 mg by mouth every morning.       No Known Allergies    The results of significant diagnostics from this hospitalization (including imaging, microbiology, ancillary and laboratory) are listed below for reference.    Significant Diagnostic Studies: Dg Chest 2 View  02/01/2013   *RADIOLOGY REPORT*  Clinical Data: Cough and fever.  CHEST - 2 VIEW  Comparison: Chest x-ray 07/28/2011.  Findings: Evaluation of the left base is poor secondary to superimposed large hiatal hernia, however, there appears to be some potential air space consolidation in the left lower lobe which may represent sequelae of aspiration or early changes related to pneumonia.  Probable small left pleural effusion.  Right lung appears clear.  Scattered small calcified granulomas.  No evidence of pulmonary edema.  Heart size appears mildly enlarged (unchanged).  Extensive atherosclerosis of the thoracic aorta. Upper mediastinal contours are otherwise within normal limits. Postoperative changes of left  shoulder hemiarthroplasty are noted.  IMPRESSION: 1.  Potential early airspace consolidation in the left lower lobe which may represent sequelae of aspiration or developing infection. There is also likely a small left pleural effusion. 2.  Large hiatal hernia. 3.  Extensive atherosclerosis.   Original Report Authenticated By: Trudie Reed, M.D.   US Abdomen Complete  02/02/2013   *RADIOLOGY REPORT*  Clinical Data:  Fever, hysterectomy  COMPLETE ABDOMINAL ULTRASOUND  Comparison:  None.  Findings:  Gallbladder:  No gallstones, gallbladder wall thickening, or pericholecystic fluid.  Common bile duct:  Upper  lungs are normal as 6 mm.  Liver:  No focal lesion identified.  Within normal limits in parenchymal echogenicity.  IVC:  Appears normal.  Pancreas:  No focal abnormality seen.  Spleen:  Normal sized echogenicity.  Right Kidney:  10.9cm in length.  No evidence of hydronephrosis or stones.  Left Kidney:  10.7cm in length.  There is anechoic cyst measuring 2.5 cm.  Abdominal aorta:  No aneurysm identified.  IMPRESSION:  1.  Normal gallbladder. 2.  Common bile duct is upper limits of normal.   Original Report Authenticated By: Genevive Bi, M.D.    Microbiology: No results found for this or any previous visit (from the past 240 hour(s)).   Labs: Basic Metabolic Panel:  Recent Labs Lab 02/01/13 1948 02/02/13 0525 02/03/13 0500  NA 133*  --  135  K 4.2  --  3.8  CL 96  --  101  CO2 25  --  25  GLUCOSE 143*  --  115*  BUN 28*  --  19  CREATININE 1.50* 1.41* 1.11*  CALCIUM 9.9  --  9.1   Liver Function Tests:  Recent Labs Lab 02/01/13 1948 02/03/13 0500  AST 302* 107*  ALT 286* 177*  ALKPHOS 209* 201*  BILITOT 0.8 0.7  PROT 7.0 5.7*  ALBUMIN 3.6 2.8*   No results found for this basename: LIPASE, AMYLASE,  in the last 168 hours No results found for this basename: AMMONIA,  in the last 168 hours CBC:  Recent Labs Lab 02/01/13 1948 02/02/13 0525  WBC 6.3 4.6  NEUTROABS 5.5   --   HGB 15.2* 13.2  HCT 44.4 40.6  MCV 91.4 91.9  PLT 131* 116*   Cardiac Enzymes: No results found for this basename: CKTOTAL, CKMB, CKMBINDEX, TROPONINI,  in the last 168 hours BNP: BNP (last 3 results) No results found for this basename: PROBNP,  in the last 8760 hours CBG: No results found for this basename: GLUCAP,  in the last 168 hours     Signed:  [name],[name]  [facility] 02/04/2013, 8:24 AM

## 2013-02-04 NOTE — Progress Notes (Signed)
Patient ambulated to bathroom with assist of front wheel walker and one person.  Her gait was steady once she stood, a bit shaky standing, voided large amount urine, returned to recliner.

## 2013-02-04 NOTE — Progress Notes (Signed)
Patient dressed and awaiting arrival of [facility] to transport to [facility]

## 2013-02-04 NOTE — Progress Notes (Signed)
Clinical Social Work  CSW reviewed chart which stated patient is medically stable to DC. CSW updated FL2 and faxed information to [facility] to review. CSW spoke with Anette Riedel at ALF who will review information and will call CSW and give time for patient to return.  [place], [place] [phone]

## 2013-02-04 NOTE — Progress Notes (Signed)
Clinical Social Work  CSW spoke to ALF who is agreeable to accept patient today. CSW has called and left a message with son to determine if he would like to transport patient back to ALF or if PTAR is needed. CSW will continue to follow.  [place], [place] [phone]

## 2013-02-04 NOTE — Progress Notes (Signed)
Clinical Social Work  CSW spoke with son who is agreeable to DC. ALF is ready to accept facility. RN agreeable to DC. CSW received Yvonne Donovan # [id number]. CSW prepared DC packet with FL2 and hard scripts included. CSW coordinated transportation via [place]. (Service Request # [id number]). CSW is signing off but available if needed.  [place], [place] [phone]

## 2013-08-19 ENCOUNTER — Other Ambulatory Visit: Payer: Self-pay | Admitting: Physician Assistant

## 2013-08-19 ENCOUNTER — Inpatient Hospital Stay (HOSPITAL_COMMUNITY)
Admission: EM | Admit: 2013-08-19 | Discharge: 2013-08-24 | DRG: 481 | Disposition: A | Discharge status: Skilled Nursing Facility | Payer: Medicare Other | Attending: Internal Medicine | Admitting: Internal Medicine

## 2013-08-19 ENCOUNTER — Encounter (HOSPITAL_COMMUNITY): Payer: Self-pay | Admitting: Emergency Medicine

## 2013-08-19 ENCOUNTER — Emergency Department (HOSPITAL_COMMUNITY): Payer: Medicare Other

## 2013-08-19 DIAGNOSIS — W19XXXA Unspecified fall, initial encounter: Secondary | ICD-10-CM

## 2013-08-19 DIAGNOSIS — Z7982 Long term (current) use of aspirin: Secondary | ICD-10-CM

## 2013-08-19 DIAGNOSIS — F419 Anxiety disorder, unspecified: Secondary | ICD-10-CM

## 2013-08-19 DIAGNOSIS — W010XXA Fall on same level from slipping, tripping and stumbling without subsequent striking against object, initial encounter: Secondary | ICD-10-CM | POA: Diagnosis present

## 2013-08-19 DIAGNOSIS — J189 Pneumonia, unspecified organism: Secondary | ICD-10-CM

## 2013-08-19 DIAGNOSIS — S72143A Displaced intertrochanteric fracture of unspecified femur, initial encounter for closed fracture: Principal | ICD-10-CM | POA: Diagnosis present

## 2013-08-19 DIAGNOSIS — I509 Heart failure, unspecified: Secondary | ICD-10-CM | POA: Diagnosis present

## 2013-08-19 DIAGNOSIS — D649 Anemia, unspecified: Secondary | ICD-10-CM | POA: Diagnosis present

## 2013-08-19 DIAGNOSIS — E785 Hyperlipidemia, unspecified: Secondary | ICD-10-CM | POA: Diagnosis present

## 2013-08-19 DIAGNOSIS — S72001A Fracture of unspecified part of neck of right femur, initial encounter for closed fracture: Secondary | ICD-10-CM

## 2013-08-19 DIAGNOSIS — I10 Essential (primary) hypertension: Secondary | ICD-10-CM | POA: Diagnosis present

## 2013-08-19 DIAGNOSIS — S72009A Fracture of unspecified part of neck of unspecified femur, initial encounter for closed fracture: Secondary | ICD-10-CM

## 2013-08-19 DIAGNOSIS — Z87891 Personal history of nicotine dependence: Secondary | ICD-10-CM

## 2013-08-19 DIAGNOSIS — F0390 Unspecified dementia without behavioral disturbance: Secondary | ICD-10-CM | POA: Diagnosis present

## 2013-08-19 DIAGNOSIS — K219 Gastro-esophageal reflux disease without esophagitis: Secondary | ICD-10-CM | POA: Diagnosis present

## 2013-08-19 DIAGNOSIS — E039 Hypothyroidism, unspecified: Secondary | ICD-10-CM | POA: Diagnosis present

## 2013-08-19 DIAGNOSIS — I4891 Unspecified atrial fibrillation: Secondary | ICD-10-CM

## 2013-08-19 DIAGNOSIS — Y92009 Unspecified place in unspecified non-institutional (private) residence as the place of occurrence of the external cause: Secondary | ICD-10-CM

## 2013-08-19 DIAGNOSIS — N179 Acute kidney failure, unspecified: Secondary | ICD-10-CM | POA: Diagnosis present

## 2013-08-19 DIAGNOSIS — E7800 Pure hypercholesterolemia, unspecified: Secondary | ICD-10-CM | POA: Diagnosis present

## 2013-08-19 DIAGNOSIS — R339 Retention of urine, unspecified: Secondary | ICD-10-CM | POA: Diagnosis not present

## 2013-08-19 DIAGNOSIS — E860 Dehydration: Secondary | ICD-10-CM | POA: Diagnosis present

## 2013-08-19 LAB — URINALYSIS, ROUTINE W REFLEX MICROSCOPIC
Bilirubin Urine: NEGATIVE
Hgb urine dipstick: NEGATIVE
Ketones, ur: NEGATIVE mg/dL
Nitrite: NEGATIVE
Protein, ur: NEGATIVE mg/dL
Specific Gravity, Urine: 1.018 (ref 1.005–1.030)
Urobilinogen, UA: 1 mg/dL (ref 0.0–1.0)
pH: 5.5 (ref 5.0–8.0)

## 2013-08-19 LAB — PROTIME-INR
INR: 1.09 (ref 0.00–1.49)
Prothrombin Time: 13.9 seconds (ref 11.6–15.2)

## 2013-08-19 LAB — CBC WITH DIFFERENTIAL/PLATELET
Basophils Relative: 1 % (ref 0–1)
Eosinophils Absolute: 0.1 10*3/uL (ref 0.0–0.7)
Eosinophils Relative: 1 % (ref 0–5)
Hemoglobin: 12.7 g/dL (ref 12.0–15.0)
MCH: 31.6 pg (ref 26.0–34.0)
MCHC: 33.8 g/dL (ref 30.0–36.0)
MCV: 93.5 fL (ref 78.0–100.0)
Monocytes Relative: 9 % (ref 3–12)
Neutrophils Relative %: 67 % (ref 43–77)
Platelets: 132 10*3/uL — ABNORMAL LOW (ref 150–400)

## 2013-08-19 LAB — COMPREHENSIVE METABOLIC PANEL
ALT: 24 U/L (ref 0–35)
Albumin: 3.5 g/dL (ref 3.5–5.2)
Alkaline Phosphatase: 68 U/L (ref 39–117)
BUN: 21 mg/dL (ref 6–23)
CO2: 26 mEq/L (ref 19–32)
Calcium: 9.2 mg/dL (ref 8.4–10.5)
GFR calc Af Amer: 48 mL/min — ABNORMAL LOW (ref >90)
GFR calc non Af Amer: 41 mL/min — ABNORMAL LOW (ref >90)
Glucose, Bld: 111 mg/dL — ABNORMAL HIGH (ref 70–99)
Potassium: 4.4 mEq/L (ref 3.5–5.1)
Sodium: 133 mEq/L — ABNORMAL LOW (ref 135–145)
Total Protein: 6.1 g/dL (ref 6.0–8.3)

## 2013-08-19 LAB — TYPE AND SCREEN
ABO/RH(D): A POS
Antibody Screen: NEGATIVE

## 2013-08-19 LAB — URINE MICROSCOPIC-ADD ON

## 2013-08-19 LAB — ABO/RH: ABO/RH(D): A POS

## 2013-08-19 MED ORDER — CHLORHEXIDINE GLUCONATE 4 % EX LIQD
60.0000 mL | Freq: Once | CUTANEOUS | Status: DC
  Filled 2013-08-19: qty 60

## 2013-08-19 MED ORDER — LAMOTRIGINE 25 MG PO TABS
25.0000 mg | ORAL_TABLET | Freq: Two times a day (BID) | ORAL | Status: DC
  Administered 2013-08-20 – 2013-08-24 (×10): 25 mg via ORAL
  Filled 2013-08-19 (×12): qty 1

## 2013-08-19 MED ORDER — CITALOPRAM HYDROBROMIDE 10 MG PO TABS
10.0000 mg | ORAL_TABLET | Freq: Every day | ORAL | Status: DC
  Administered 2013-08-20 – 2013-08-24 (×5): 10 mg via ORAL
  Filled 2013-08-19 (×6): qty 1

## 2013-08-19 MED ORDER — RISPERIDONE 0.25 MG PO TABS
0.2500 mg | ORAL_TABLET | Freq: Every day | ORAL | Status: DC
  Administered 2013-08-20 – 2013-08-23 (×5): 0.25 mg via ORAL
  Filled 2013-08-19 (×7): qty 1

## 2013-08-19 MED ORDER — HYDRALAZINE HCL 25 MG PO TABS
25.0000 mg | ORAL_TABLET | Freq: Three times a day (TID) | ORAL | Status: DC
  Administered 2013-08-20 – 2013-08-24 (×10): 25 mg via ORAL
  Filled 2013-08-19 (×17): qty 1

## 2013-08-19 MED ORDER — LACTATED RINGERS IV SOLN: INTRAVENOUS | Status: DC

## 2013-08-19 MED ORDER — ENOXAPARIN SODIUM 40 MG/0.4ML ~~LOC~~ SOLN
40.0000 mg | SUBCUTANEOUS | Status: DC
  Administered 2013-08-20 – 2013-08-22 (×3): 40 mg via SUBCUTANEOUS
  Filled 2013-08-19 (×5): qty 0.4

## 2013-08-19 MED ORDER — SIMVASTATIN 10 MG PO TABS
10.0000 mg | ORAL_TABLET | Freq: Every day | ORAL | Status: DC
  Administered 2013-08-20 – 2013-08-23 (×4): 10 mg via ORAL
  Filled 2013-08-19 (×5): qty 1

## 2013-08-19 MED ORDER — MORPHINE SULFATE 2 MG/ML IJ SOLN
0.5000 mg | INTRAMUSCULAR | Status: DC | PRN
  Administered 2013-08-19: 22:00:00 0.5 mg via INTRAVENOUS
  Filled 2013-08-19: qty 1

## 2013-08-19 MED ORDER — LISINOPRIL 5 MG PO TABS
5.0000 mg | ORAL_TABLET | Freq: Every day | ORAL | Status: DC
  Administered 2013-08-20 – 2013-08-24 (×5): 5 mg via ORAL
  Filled 2013-08-19 (×6): qty 1

## 2013-08-19 MED ORDER — CEFAZOLIN SODIUM-DEXTROSE 2-3 GM-% IV SOLR
2.0000 g | INTRAVENOUS | Status: AC
  Administered 2013-08-20: 2 g via INTRAVENOUS
  Filled 2013-08-19 (×2): qty 50

## 2013-08-19 MED ORDER — FAMOTIDINE 20 MG PO TABS
20.0000 mg | ORAL_TABLET | Freq: Every day | ORAL | Status: DC
  Administered 2013-08-20 – 2013-08-24 (×5): 20 mg via ORAL
  Filled 2013-08-19 (×5): qty 1

## 2013-08-19 MED ORDER — HYDROCODONE-ACETAMINOPHEN 5-325 MG PO TABS: 1.0000 | ORAL_TABLET | Freq: Four times a day (QID) | ORAL | Status: DC | PRN

## 2013-08-19 MED ORDER — LEVOTHYROXINE SODIUM 88 MCG PO TABS
88.0000 ug | ORAL_TABLET | Freq: Every day | ORAL | Status: DC
  Administered 2013-08-21 – 2013-08-24 (×4): 88 ug via ORAL
  Filled 2013-08-19 (×6): qty 1

## 2013-08-19 MED ORDER — ADULT MULTIVITAMIN W/MINERALS CH
1.0000 | ORAL_TABLET | Freq: Every day | ORAL | Status: DC
Start: 2013-08-20 — End: 2013-08-24
  Administered 2013-08-20 – 2013-08-24 (×5): 1 via ORAL
  Filled 2013-08-19 (×5): qty 1

## 2013-08-19 MED ORDER — SODIUM CHLORIDE 0.9 % IV SOLN
INTRAVENOUS | Status: DC
  Administered 2013-08-19 – 2013-08-23 (×3): via INTRAVENOUS

## 2013-08-19 MED ORDER — ASPIRIN EC 81 MG PO TBEC
81.0000 mg | DELAYED_RELEASE_TABLET | Freq: Every day | ORAL | Status: DC
  Filled 2013-08-19: qty 1

## 2013-08-19 MED ORDER — DOCUSATE SODIUM 100 MG PO CAPS
100.0000 mg | ORAL_CAPSULE | Freq: Two times a day (BID) | ORAL | Status: DC
  Administered 2013-08-20 – 2013-08-24 (×10): 100 mg via ORAL
  Filled 2013-08-19 (×11): qty 1

## 2013-08-19 NOTE — ED Notes (Signed)
Bed: WA17 Expected date:  Expected time:  Means of arrival:  Comments: EMS fall 

## 2013-08-19 NOTE — Progress Notes (Signed)
[facility] progress note. Chief complaint. Transfer note. History of present illness. This 79 year old female sustained a mechanical fall landing on her right hip. She was seen at [facility] long emergency room and found to have a right hip fracture. Orthopedics was consulted and requested the patient be transferred to [facility] for surgery tomorrow a.m. Patient is now arrived am seeing the patient to ensure continued stability post- transport and that orders transferred for appropriately. Patient has no current complaints per bedside exam. Vital signs. Temperature 99.2, pulse 93, respiration 16, blood pressure 166/90. O2 sats 94%. General appearance. Well-developed only female who is alert and in no distress. Cardiac. Rate and rhythm regular. 3/6 systolic ejection murmur. Lungs. Breath sounds are clear. Abdomen. Soft with positive bowel sounds. Impression/plan. Problem #1 right hip fracture status post mechanical fall. Orthopedics has consulted and contacted me indicating surgery planned for about 7:30 AM. If any further preoperative clearance as needed this will have to be done in the early a.m. Patient appears clinically stable per bedside exam post transfer. All orders appear to of transferred appropriately.

## 2013-08-19 NOTE — Progress Notes (Signed)
Unable to place foley catheter d/t swelling of urethral meatus and decreased mobility of Rt. Hip. Pt is being transferred to [facility] at [facility] promptly.
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
## 2013-08-19 NOTE — H&P (Signed)
[facility] History and Physical  Yvonne Donovan WUJ:[id number] DOB: 03/31/1920 DOA: 08/19/2013  Referring physician: ED physician PCP: No primary provider on file.   Chief Complaint: fall  HPI:  Pt is 79 yo female with HTN, dementia, hypothyroidism, resident of SNF who fell earlier in the day and hurt her right hip. She is unable to provide details of the fall but says as she fell down she landed on her right hip and felt very sharp pain. She denies fevers, chills, numbness or tingling. No similar events in the past. In ED, she was found to have sustained right hip fracture and ortho was consulted. [facility] asked to admit to medical floor.   Assessment and Plan:  Principal Problem:   Fall - likely mechanical - will admit to medical floor - appreciate ortho assistance - will need PT post op  Active Problems:   Hip fracture, right - will follow up on orhto recommendations - analgesia as needed - PT post op   Acute renal failure - likely pre renal and secondary to dehydration - will provide IVF and repeat BMP in AM   Hypothyroidism - continue synthroid   Code Status: Full Family Communication: Pt at bedside Disposition Plan: Admit to medical floor    Review of Systems:  Constitutional: Negative for diaphoresis.  HENT: Negative for hearing loss, ear pain, nosebleeds, congestion, sore throat, neck pain, tinnitus and ear discharge.   Eyes: Negative for blurred vision, double vision, photophobia, pain, discharge and redness.  Respiratory: Negative for cough, hemoptysis, sputum production, shortness of breath, wheezing and stridor.   Cardiovascular: Negative for chest pain, palpitations, orthopnea, claudication and leg swelling.  Gastrointestinal: Negative for nausea, vomiting and abdominal pain. Negative for heartburn, constipation, blood in stool and melena.  Genitourinary: Negative for dysuria, urgency, frequency, hematuria and flank pain.  Musculoskeletal: Negative for  myalgias,.  Skin: Negative for itching and rash.  Neurological: Negative for tingling, tremors, sensory change, speech change, focal weakness, loss of consciousness and headaches.  Endo/Heme/Allergies: Negative for environmental allergies and polydipsia. Does not bruise/bleed easily.  Psychiatric/Behavioral: Negative for suicidal ideas. The patient is not nervous/anxious.      Past Medical History  Diagnosis Date  . Hypertension   . Constipation   . Pain   . Acid reflux   . Hypercholesteremia   . Hypothyroid   . CHF (congestive heart failure)   . Bronchitis   . Hyperlipidemia     Past Surgical History  Procedure Laterality Date  . Wrist surgery    . Abdominal hysterectomy      Social History:  reports that she quit smoking about 69 years ago. She has never used smokeless tobacco. She reports that she drinks alcohol. She reports that she does not use illicit drugs.  No Known Allergies  No known family medical history   Prior to Admission medications   Medication Sig Start Date End Date Taking? Authorizing Provider  aspirin EC 81 MG tablet Take 81 mg by mouth daily.   Yes Historical Provider, MD  citalopram (CELEXA) 10 MG tablet Take 1 tablet (10 mg total) by mouth daily. 02/04/13  Yes [name] A [name], MD  docusate sodium (COLACE) 100 MG capsule Take 100 mg by mouth 2 (two) times daily.     Yes Historical Provider, MD  hydrALAZINE (APRESOLINE) 25 MG tablet Take 25 mg by mouth 3 (three) times daily.   Yes Historical Provider, MD  lamoTRIgine (LAMICTAL) 25 MG tablet Take 25 mg by mouth 2 (two) times  daily.   Yes Historical Provider, MD  levothyroxine (SYNTHROID, LEVOTHROID) 88 MCG tablet Take 88 mcg by mouth daily before breakfast.   Yes Historical Provider, MD  lisinopril (PRINIVIL,ZESTRIL) 5 MG tablet Take 5 mg by mouth daily.   Yes Historical Provider, MD  Multiple Vitamin (MULITIVITAMIN WITH MINERALS) TABS Take 1 tablet by mouth daily.   Yes Historical Provider, MD   ranitidine (ZANTAC) 150 MG capsule Take 1 capsule (150 mg total) by mouth daily. 02/04/13  Yes [name] A [name], MD  risperiDONE (RISPERDAL) 0.25 MG tablet Take 0.25 mg by mouth at bedtime.   Yes Historical Provider, MD  simvastatin (ZOCOR) 10 MG tablet Take 10 mg by mouth every morning.    Yes Historical Provider, MD    Physical Exam: Filed Vitals:   08/19/13 1518  BP: 157/74  Pulse: 71  Temp: 98.9 F (37.2 C)  TempSrc: Oral  Resp: 18  SpO2: 98%    Physical Exam  Constitutional: Appears well-developed and well-nourished. No distress.  HENT: Normocephalic. External right and left ear normal. Oropharynx is clear and moist.  Eyes: Conjunctivae and EOM are normal. PERRLA, no scleral icterus.  Neck: Normal ROM. Neck supple. No JVD. No tracheal deviation. No thyromegaly.  CVS: RRR, S1/S2 +, no murmurs, no gallops, no carotid bruit.  Pulmonary: Effort and breath sounds normal, no stridor, rhonchi, wheezes, rales.  Abdominal: Soft. BS +,  no distension, tenderness, rebound or guarding.  Musculoskeletal: Normal range of motion. Tenderness around right hip area.  Lymphadenopathy: No lymphadenopathy noted, cervical, inguinal. Neuro: Alert. Normal reflexes, muscle tone coordination. No cranial nerve deficit. Skin: Skin is warm and dry. No rash noted. Not diaphoretic. No erythema. No pallor.  Psychiatric: Normal mood and affect. Behavior, judgment, thought content normal.   Labs on Admission:  Basic Metabolic Panel:  Recent Labs Lab 08/19/13 1644  NA 133*  K 4.4  CL 98  CO2 26  GLUCOSE 111*  BUN 21  CREATININE 1.13*  CALCIUM 9.2   Liver Function Tests:  Recent Labs Lab 08/19/13 1644  AST 30  ALT 24  ALKPHOS 68  BILITOT 0.5  PROT 6.1  ALBUMIN 3.5   CBC:  Recent Labs Lab 08/19/13 1644  WBC 7.0  NEUTROABS 4.7  HGB 12.7  HCT 37.6  MCV 93.5  PLT 132*   Radiological Exams on Admission: Dg Chest 1 View   08/19/2013   Stable marked cardiomegaly.  No acute  cardiopulmonary disease. Large hiatal hernia.  Dg Hip Complete Right   08/19/2013  Acute nondisplaced intertrochanteric right femoral neck fracture. Symmetric moderate osteoarthritis in both hips.    EKG: Normal sinus rhythm, no ST/T wave changes  [name], MD  [facility] Pager [phone]  If 7PM-7AM, please contact night-coverage [email or web address] Password [facility] 08/19/2013, 5:35 PM

## 2013-08-19 NOTE — Consult Note (Signed)
Reason for Consult:  Acute nondisplaced intertrochanteric right femoral neck fracture. Moderate axial joint space narrowing.    Referring Physician:  [name] is an 79 y.o. female.  HPI: Yvonne Donovan is a very pleasant 79 y/o female who presented to [facility] Emergency Department this afternoon after she sustained a fall at her nursing home.  She was seen and x-rays were reviewed which showed a nondisplaced intertrochanteric right femoral neck fracture.  We will take her to the operating room tomorrow at [facility] for ORIF.  Procedure, risks, benefits and complications reviewed and discussed with the family.  We will see her at time of surgery.    Past Medical History  Diagnosis Date  . Hypertension   . Constipation   . Pain   . Acid reflux   . Hypercholesteremia   . Hypothyroid   . CHF (congestive heart failure)   . Bronchitis   . Hyperlipidemia     Past Surgical History  Procedure Laterality Date  . Wrist surgery    . Abdominal hysterectomy      History reviewed. No pertinent family history.  Social History:  reports that she quit smoking about 69 years ago. She has never used smokeless tobacco. She reports that she drinks alcohol. She reports that she does not use illicit drugs.  Allergies: No Known Allergies  Medications:   Results for orders placed during the hospital encounter of 08/19/13 (from the past 48 hour(s))  CBC WITH DIFFERENTIAL     Status: Abnormal   Collection Time    08/19/13  4:44 PM      Result Value Range   WBC 7.0  4.0 - 10.5 K/uL   RBC 4.02  3.87 - 5.11 MIL/uL   Hemoglobin 12.7  12.0 - 15.0 g/dL   HCT 40.9  81.1 - 91.4 %   MCV 93.5  78.0 - 100.0 fL   MCH 31.6  26.0 - 34.0 pg   MCHC 33.8  30.0 - 36.0 g/dL   RDW 78.2  95.6 - 21.3 %   Platelets 132 (*) 150 - 400 K/uL   Neutrophils Relative % 67  43 - 77 %   Neutro Abs 4.7  1.7 - 7.7 K/uL   Lymphocytes Relative 23  12 - 46 %   Lymphs Abs 1.6  0.7 - 4.0 K/uL   Monocytes Relative  9  3 - 12 %   Monocytes Absolute 0.6  0.1 - 1.0 K/uL   Eosinophils Relative 1  0 - 5 %   Eosinophils Absolute 0.1  0.0 - 0.7 K/uL   Basophils Relative 1  0 - 1 %   Basophils Absolute 0.0  0.0 - 0.1 K/uL  COMPREHENSIVE METABOLIC PANEL     Status: Abnormal   Collection Time    08/19/13  4:44 PM      Result Value Range   Sodium 133 (*) 135 - 145 mEq/L   Potassium 4.4  3.5 - 5.1 mEq/L   Chloride 98  96 - 112 mEq/L   CO2 26  19 - 32 mEq/L   Glucose, Bld 111 (*) 70 - 99 mg/dL   BUN 21  6 - 23 mg/dL   Creatinine, Ser 0.86 (*) 0.50 - 1.10 mg/dL   Calcium 9.2  8.4 - 57.8 mg/dL   Total Protein 6.1  6.0 - 8.3 g/dL   Albumin 3.5  3.5 - 5.2 g/dL   AST 30  0 - 37 U/L   ALT 24  0 -  35 U/L   Alkaline Phosphatase 68  39 - 117 U/L   Total Bilirubin 0.5  0.3 - 1.2 mg/dL   GFR calc non Af Amer 41 (*) >90 mL/min   GFR calc Af Amer 48 (*) >90 mL/min   Comment: (NOTE)     The eGFR has been calculated using the CKD EPI equation.     This calculation has not been validated in all clinical situations.     eGFR's persistently <90 mL/min signify possible Chronic Kidney     Disease.  PROTIME-INR     Status: None   Collection Time    08/19/13  4:44 PM      Result Value Range   Prothrombin Time 13.9  11.6 - 15.2 seconds   INR 1.09  0.00 - 1.49  TYPE AND SCREEN     Status: None   Collection Time    08/19/13  4:46 PM      Result Value Range   ABO/RH(D) A POS     Antibody Screen NEG     Sample Expiration 08/22/2013    ABO/RH     Status: None   Collection Time    08/19/13  4:46 PM      Result Value Range   ABO/RH(D) A POS    URINALYSIS, ROUTINE W REFLEX MICROSCOPIC     Status: Abnormal   Collection Time    08/19/13  6:32 PM      Result Value Range   Color, Urine YELLOW  YELLOW   APPearance CLOUDY (*) CLEAR   Specific Gravity, Urine 1.018  1.005 - 1.030   pH 5.5  5.0 - 8.0   Glucose, UA NEGATIVE  NEGATIVE mg/dL   Hgb urine dipstick NEGATIVE  NEGATIVE   Bilirubin Urine NEGATIVE  NEGATIVE    Ketones, ur NEGATIVE  NEGATIVE mg/dL   Protein, ur NEGATIVE  NEGATIVE mg/dL   Urobilinogen, UA 1.0  0.0 - 1.0 mg/dL   Nitrite NEGATIVE  NEGATIVE   Leukocytes, UA MODERATE (*) NEGATIVE  URINE MICROSCOPIC-ADD ON     Status: Abnormal   Collection Time    08/19/13  6:32 PM      Result Value Range   Squamous Epithelial / LPF FEW (*) RARE   WBC, UA 7-10  <3 WBC/hpf   Bacteria, UA MANY (*) RARE  SURGICAL PCR SCREEN     Status: None   Collection Time    08/19/13 11:16 PM      Result Value Range   MRSA, PCR NEGATIVE  NEGATIVE   Staphylococcus aureus NEGATIVE  NEGATIVE   Comment:            The Xpert SA Assay (FDA     approved for NASAL specimens     in patients over 44 years of age),     is one component of     a comprehensive surveillance     program.  Test performance has     been validated by [employer] for patients greater     than or equal to 73 year old.     It is not intended     to diagnose infection nor to     guide or monitor treatment.  URINALYSIS, ROUTINE W REFLEX MICROSCOPIC     Status: Abnormal   Collection Time    08/20/13  2:13 AM      Result Value Range   Color, Urine YELLOW  YELLOW   APPearance CLOUDY (*) CLEAR  Specific Gravity, Urine 1.019  1.005 - 1.030   pH 5.5  5.0 - 8.0   Glucose, UA NEGATIVE  NEGATIVE mg/dL   Hgb urine dipstick NEGATIVE  NEGATIVE   Bilirubin Urine NEGATIVE  NEGATIVE   Ketones, ur 15 (*) NEGATIVE mg/dL   Protein, ur NEGATIVE  NEGATIVE mg/dL   Urobilinogen, UA 1.0  0.0 - 1.0 mg/dL   Nitrite NEGATIVE  NEGATIVE   Leukocytes, UA NEGATIVE  NEGATIVE   Comment: MICROSCOPIC NOT DONE ON URINES WITH NEGATIVE PROTEIN, BLOOD, LEUKOCYTES, NITRITE, OR GLUCOSE <1000 mg/dL.  GLUCOSE, CAPILLARY     Status: None   Collection Time    08/20/13  6:12 AM      Result Value Range   Glucose-Capillary 92  70 - 99 mg/dL    Dg Chest 1 View  60/45/4098   CLINICAL DATA:  Acute intertrochanteric right femoral neck fracture. Preoperative respiratory  evaluation.  EXAM: CHEST - 1 VIEW  COMPARISON:  Two-view chest x-ray 02/01/2013, 07/24/2011, 02/15/2009.  FINDINGS: Cardiac silhouette markedly enlarged but stable. Thoracic aorta atherosclerotic, unchanged. Large hiatal hernia, unchanged. Calcified granuloma in the right mid lung, unchanged. Lungs otherwise clear. No localized airspace consolidation. No pleural effusions. No pneumothorax. Normal pulmonary vascularity. Left shoulder arthroplasty with anatomic alignment.  IMPRESSION: 1. Stable marked cardiomegaly.  No acute cardiopulmonary disease. 2. Large hiatal hernia.   Electronically Signed   By: Hulan Saas M.D.   On: 08/19/2013 16:26   Dg Hip Complete Right  08/19/2013   CLINICAL DATA:  Larey Seat and injured right hip earlier today.  EXAM: RIGHT HIP - COMPLETE 2+ VIEW  COMPARISON:  None.  FINDINGS: Acute nondisplaced intertrochanteric right femoral neck fracture. Moderate axial joint space narrowing.  Included AP pelvis demonstrates symmetric moderate axial joint space narrowing in the left hip. Dystrophic calcification in the soft tissues below the left inferior pubic ramus. Sacroiliac joints intact with degenerative changes. Symphysis pubis intact. Degenerative changes involving the visualized lower lumbar spine.  IMPRESSION: 1. Acute nondisplaced intertrochanteric right femoral neck fracture. 2. Symmetric moderate osteoarthritis in both hips.   Electronically Signed   By: Hulan Saas M.D.   On: 08/19/2013 16:24    ROS Blood pressure 137/74, pulse 87, temperature 98.5 F (36.9 C), temperature source Oral, resp. rate 16, SpO2 94.00%. Physical Exam  Assessment/Plan: ORIF right hip fracture at [facility] on 08/20/13  ANTON, M. LINDSEY 08/20/2013, 7:15 AM

## 2013-08-20 ENCOUNTER — Encounter (HOSPITAL_COMMUNITY): Payer: Self-pay | Admitting: Anesthesiology

## 2013-08-20 ENCOUNTER — Inpatient Hospital Stay (HOSPITAL_COMMUNITY): Payer: Medicare Other

## 2013-08-20 ENCOUNTER — Inpatient Hospital Stay (HOSPITAL_COMMUNITY): Payer: Medicare Other | Admitting: Certified Registered Nurse Anesthetist

## 2013-08-20 ENCOUNTER — Encounter (HOSPITAL_COMMUNITY): Payer: Medicare Other | Admitting: Certified Registered Nurse Anesthetist

## 2013-08-20 ENCOUNTER — Encounter (HOSPITAL_COMMUNITY)
Admission: EM | Disposition: A | Discharge status: Skilled Nursing Facility | Payer: Self-pay | Source: Home / Self Care | Attending: Internal Medicine

## 2013-08-20 DIAGNOSIS — I509 Heart failure, unspecified: Secondary | ICD-10-CM

## 2013-08-20 HISTORY — PX: INTRAMEDULLARY (IM) NAIL INTERTROCHANTERIC: SHX5875

## 2013-08-20 LAB — URINALYSIS, ROUTINE W REFLEX MICROSCOPIC
Glucose, UA: NEGATIVE mg/dL
Leukocytes, UA: NEGATIVE
Nitrite: NEGATIVE
Protein, ur: NEGATIVE mg/dL

## 2013-08-20 LAB — SURGICAL PCR SCREEN
MRSA, PCR: NEGATIVE
Staphylococcus aureus: NEGATIVE

## 2013-08-20 LAB — GLUCOSE, CAPILLARY: Glucose-Capillary: 92 mg/dL (ref 70–99)

## 2013-08-20 SURGERY — INSERTION, INTRAMEDULLARY ROD, FEMUR
Anesthesia: General | Laterality: Right

## 2013-08-20 SURGERY — FIXATION, FRACTURE, INTERTROCHANTERIC, WITH INTRAMEDULLARY ROD
Anesthesia: General | Laterality: Right

## 2013-08-20 MED ORDER — ROCURONIUM BROMIDE 100 MG/10ML IV SOLN
INTRAVENOUS | Status: DC | PRN
  Administered 2013-08-20: 30 mg via INTRAVENOUS

## 2013-08-20 MED ORDER — HYDROCODONE-ACETAMINOPHEN 5-325 MG PO TABS: 1.0000 | ORAL_TABLET | Freq: Four times a day (QID) | ORAL | Status: DC | PRN

## 2013-08-20 MED ORDER — BUPIVACAINE-EPINEPHRINE PF 0.5-1:200000 % IJ SOLN
INTRAMUSCULAR | Status: DC | PRN
  Administered 2013-08-20: 4 mL

## 2013-08-20 MED ORDER — METOCLOPRAMIDE HCL 5 MG/ML IJ SOLN: 10.0000 mg | Freq: Once | INTRAMUSCULAR | Status: AC | PRN

## 2013-08-20 MED ORDER — ASPIRIN EC 325 MG PO TBEC: 325.0000 mg | DELAYED_RELEASE_TABLET | Freq: Every day | ORAL | Status: DC

## 2013-08-20 MED ORDER — PHENYLEPHRINE HCL 10 MG/ML IJ SOLN
INTRAMUSCULAR | Status: DC | PRN
  Administered 2013-08-20: 40 ug via INTRAVENOUS
  Administered 2013-08-20 (×2): 80 ug via INTRAVENOUS
  Administered 2013-08-20: 40 ug via INTRAVENOUS
  Administered 2013-08-20 (×2): 80 ug via INTRAVENOUS

## 2013-08-20 MED ORDER — BUPIVACAINE-EPINEPHRINE (PF) 0.5% -1:200000 IJ SOLN
INTRAMUSCULAR | Status: AC
  Filled 2013-08-20: qty 10

## 2013-08-20 MED ORDER — SODIUM CHLORIDE 0.9 % IV SOLN
INTRAVENOUS | Status: DC | PRN
  Administered 2013-08-20: 07:00:00 via INTRAVENOUS

## 2013-08-20 MED ORDER — CEFAZOLIN SODIUM-DEXTROSE 2-3 GM-% IV SOLR
2.0000 g | Freq: Four times a day (QID) | INTRAVENOUS | Status: AC
  Administered 2013-08-20 (×2): 2 g via INTRAVENOUS
  Filled 2013-08-20 (×2): qty 50

## 2013-08-20 MED ORDER — 0.9 % SODIUM CHLORIDE (POUR BTL) OPTIME
TOPICAL | Status: DC | PRN
  Administered 2013-08-20: 1000 mL

## 2013-08-20 MED ORDER — ONDANSETRON HCL 4 MG/2ML IJ SOLN
INTRAMUSCULAR | Status: DC | PRN
  Administered 2013-08-20: 4 mg via INTRAVENOUS

## 2013-08-20 MED ORDER — ONDANSETRON HCL 4 MG/2ML IJ SOLN: 4.0000 mg | Freq: Four times a day (QID) | INTRAMUSCULAR | Status: DC | PRN

## 2013-08-20 MED ORDER — FENTANYL CITRATE 0.05 MG/ML IJ SOLN: 25.0000 ug | INTRAMUSCULAR | Status: DC | PRN

## 2013-08-20 MED ORDER — NEOSTIGMINE METHYLSULFATE 1 MG/ML IJ SOLN
INTRAMUSCULAR | Status: DC | PRN
  Administered 2013-08-20: 3 mg via INTRAVENOUS

## 2013-08-20 MED ORDER — LIDOCAINE HCL (CARDIAC) 20 MG/ML IV SOLN
INTRAVENOUS | Status: DC | PRN
  Administered 2013-08-20: 60 mg via INTRAVENOUS

## 2013-08-20 MED ORDER — MENTHOL 3 MG MT LOZG: 1.0000 | LOZENGE | OROMUCOSAL | Status: DC | PRN

## 2013-08-20 MED ORDER — ACETAMINOPHEN 650 MG RE SUPP: 650.0000 mg | Freq: Four times a day (QID) | RECTAL | Status: DC | PRN

## 2013-08-20 MED ORDER — ASPIRIN EC 325 MG PO TBEC
325.0000 mg | DELAYED_RELEASE_TABLET | Freq: Every day | ORAL | Status: DC
  Administered 2013-08-21 – 2013-08-24 (×4): 325 mg via ORAL
  Filled 2013-08-20 (×7): qty 1

## 2013-08-20 MED ORDER — METOCLOPRAMIDE HCL 10 MG PO TABS: 5.0000 mg | ORAL_TABLET | Freq: Three times a day (TID) | ORAL | Status: DC | PRN

## 2013-08-20 MED ORDER — PROPOFOL 10 MG/ML IV BOLUS
INTRAVENOUS | Status: DC | PRN
  Administered 2013-08-20: 100 mg via INTRAVENOUS

## 2013-08-20 MED ORDER — GLYCOPYRROLATE 0.2 MG/ML IJ SOLN
INTRAMUSCULAR | Status: DC | PRN
  Administered 2013-08-20: .6 mg via INTRAVENOUS

## 2013-08-20 MED ORDER — DEXAMETHASONE SODIUM PHOSPHATE 4 MG/ML IJ SOLN
INTRAMUSCULAR | Status: DC | PRN
  Administered 2013-08-20: 4 mg via INTRAVENOUS

## 2013-08-20 MED ORDER — ONDANSETRON HCL 4 MG PO TABS: 4.0000 mg | ORAL_TABLET | Freq: Four times a day (QID) | ORAL | Status: DC | PRN

## 2013-08-20 MED ORDER — BOOST / RESOURCE BREEZE PO LIQD
1.0000 | Freq: Three times a day (TID) | ORAL | Status: DC
  Administered 2013-08-20 – 2013-08-24 (×11): 1 via ORAL

## 2013-08-20 MED ORDER — ACETAMINOPHEN 325 MG PO TABS
650.0000 mg | ORAL_TABLET | Freq: Four times a day (QID) | ORAL | Status: DC | PRN
  Administered 2013-08-21 – 2013-08-23 (×3): 650 mg via ORAL
  Filled 2013-08-20 (×3): qty 2

## 2013-08-20 MED ORDER — FENTANYL CITRATE 0.05 MG/ML IJ SOLN
INTRAMUSCULAR | Status: DC | PRN
  Administered 2013-08-20 (×2): 50 ug via INTRAVENOUS

## 2013-08-20 MED ORDER — METOCLOPRAMIDE HCL 5 MG/ML IJ SOLN: 5.0000 mg | Freq: Three times a day (TID) | INTRAMUSCULAR | Status: DC | PRN

## 2013-08-20 MED ORDER — PHENOL 1.4 % MT LIQD: 1.0000 | OROMUCOSAL | Status: DC | PRN

## 2013-08-20 MED ORDER — MORPHINE SULFATE 2 MG/ML IJ SOLN: 0.5000 mg | INTRAMUSCULAR | Status: DC | PRN

## 2013-08-20 SURGICAL SUPPLY — 27 items
COVER SURGICAL LIGHT HANDLE (MISCELLANEOUS) ×2 IMPLANT
DRAPE STERI IOBAN 125X83 (DRAPES) ×2 IMPLANT
DRESSING ADAPTIC 1/2  N-ADH (PACKING) ×2 IMPLANT
DRSG TEGADERM 4X4.75 (GAUZE/BANDAGES/DRESSINGS) ×2 IMPLANT
DURAPREP 26ML APPLICATOR (WOUND CARE) ×2 IMPLANT
GLOVE BIO SURGEON STRL SZ 6.5 (GLOVE) ×2 IMPLANT
GLOVE BIO SURGEON STRL SZ7 (GLOVE) ×2 IMPLANT
GLOVE BIOGEL PI IND STRL 8 (GLOVE) ×1 IMPLANT
GLOVE BIOGEL PI INDICATOR 8 (GLOVE) ×1
GOWN STRL NON-REIN LRG LVL3 (GOWN DISPOSABLE) ×2 IMPLANT
GOWN STRL REIN 2XL LVL4 (GOWN DISPOSABLE) ×4 IMPLANT
GUIDEROD T2 3X1000 (ROD) ×2 IMPLANT
K-WIRE  3.2X450M STR (WIRE) ×1
K-WIRE 3.2X450M STR (WIRE) ×1
KIT BASIN OR (CUSTOM PROCEDURE TRAY) ×2 IMPLANT
KIT NAIL LONG 10X380MMX125 (Nail) ×2 IMPLANT
KIT ROOM TURNOVER OR (KITS) ×2 IMPLANT
KWIRE 3.2X450M STR (WIRE) ×1 IMPLANT
NS IRRIG 1000ML POUR BTL (IV SOLUTION) ×2 IMPLANT
PACK GENERAL/GYN (CUSTOM PROCEDURE TRAY) ×2 IMPLANT
PAD ARMBOARD 7.5X6 YLW CONV (MISCELLANEOUS) ×2 IMPLANT
SCREW LAG GAMMA 3 95MM (Screw) ×2 IMPLANT
SPONGE GAUZE 4X4 12PLY (GAUZE/BANDAGES/DRESSINGS) ×2 IMPLANT
SUT MNCRL AB 4-0 PS2 18 (SUTURE) ×2 IMPLANT
SUT MON AB 2-0 CT1 36 (SUTURE) ×2 IMPLANT
TOWEL OR 17X24 6PK STRL BLUE (TOWEL DISPOSABLE) ×2 IMPLANT
TOWEL OR 17X26 10 PK STRL BLUE (TOWEL DISPOSABLE) ×2 IMPLANT

## 2013-08-20 NOTE — Progress Notes (Signed)
INITIAL NUTRITION ASSESSMENT  DOCUMENTATION CODES Per approved criteria  -Not Applicable   INTERVENTION: Resource Breeze po TID, each supplement provides 250 kcal and 9 grams of protein RD to follow for nutrition care plan  NUTRITION DIAGNOSIS: Increased nutrient needs related to hip fracture as evidenced by estimated nutrition needs  Goal: Pt to meet >/= 90% of their estimated nutrition needs  Monitor:  PO & supplemental intake, weight, labs, I/O's  Reason for Assessment: Consult  79 y.o. female  Admitting Dx: Fall  ASSESSMENT: Patient with PMH of CHF, HTN and constipation; seen at [facility] Emergency Department after she sustained a fall at her nursing home; X-rays showed a nondisplaced intertrochanteric right femoral neck fracture -- transferred to [facility] for surgery.  Patient s/p procedure 12/13: INTRAMEDULLARY (IM) NAIL INTERTROCHANTRIC RIGHT HIP (Right)  RD unable to interview patient; spoke briefly to patient's son Ron via telephone (stated he was out of town and his wife was more involved with patient's care); no family present at bedside upon visit; patient with likely suboptimal intake during hospitalization; would benefit from addition of nutrition supplements -- RD to order.  RD unable to complete Nutrition Focused Physical Exam at this time for possible malnutrition identification.   Height: Ht Readings from Last 1 Encounters:  02/03/13 5\' 5"  (1.651 m)    Weight: Wt Readings from Last 1 Encounters:  02/03/13 136 lb (61.689 kg)    Ideal Body Weight: 125 lb  % Ideal Body Weight: 108%  Wt Readings from Last 10 Encounters:  02/03/13 136 lb (61.689 kg)  07/28/11 142 lb 13.7 oz (64.799 kg)    Usual Body Weight: unable to obtain  % Usual Body Weight: ---  BMI:  22.7 kg/m2  Estimated Nutritional Needs: Kcal: 1400-1600 Protein: 65-75 gm Fluid: >/= 1.5 L  Skin: hip surgical incision   Diet Order: Clear Liquid  EDUCATION NEEDS: -No education  needs identified at this time   Intake/Output Summary (Last 24 hours) at 08/20/13 1223 Last data filed at 08/20/13 0926  Gross per 24 hour  Intake    700 ml  Output   1300 ml  Net   -600 ml    Labs:   Recent Labs Lab 08/19/13 1644  NA 133*  K 4.4  CL 98  CO2 26  BUN 21  CREATININE 1.13*  CALCIUM 9.2  GLUCOSE 111*    CBG (last 3)   Recent Labs  08/20/13 0612  GLUCAP 92    Scheduled Meds: . [START ON 08/21/2013] aspirin EC  325 mg Oral Q breakfast  .  ceFAZolin (ANCEF) IV  2 g Intravenous Q6H  . citalopram  10 mg Oral Daily  . docusate sodium  100 mg Oral BID  . enoxaparin (LOVENOX) injection  40 mg Subcutaneous Q24H  . famotidine  20 mg Oral Daily  . hydrALAZINE  25 mg Oral TID  . lamoTRIgine  25 mg Oral BID  . levothyroxine  88 mcg Oral QAC breakfast  . lisinopril  5 mg Oral Daily  . multivitamin with minerals  1 tablet Oral Daily  . risperiDONE  0.25 mg Oral QHS  . simvastatin  10 mg Oral q1800    Continuous Infusions: . sodium chloride 75 mL/hr at 08/19/13 2259    Past Medical History  Diagnosis Date  . Hypertension   . Constipation   . Pain   . Acid reflux   . Hypercholesteremia   . Hypothyroid   . CHF (congestive heart failure)   . Bronchitis   .  Hyperlipidemia     Past Surgical History  Procedure Laterality Date  . Wrist surgery    . Abdominal hysterectomy      Maureen Chatters, RD, LDN Pager #: [phone] After-Hours Pager #: [phone]

## 2013-08-20 NOTE — H&P (View-Only) (Signed)
Reason for Consult:  Acute nondisplaced intertrochanteric right femoral neck fracture. Moderate axial joint space narrowing.    Referring Physician:  Yvonne Donovan is an 79 y.o. female.  HPI: Yvonne Donovan is a very pleasant 79 y/o female who presented to [facility] this afternoon after she sustained a fall at her nursing home.  She was seen and x-rays were reviewed which showed a nondisplaced intertrochanteric right femoral neck fracture.  We will take her to the operating room tomorrow at [facility] for ORIF.  Procedure, risks, benefits and complications reviewed and discussed with the family.  We will see her at time of surgery.    Past Medical History  Diagnosis Date  . Hypertension   . Constipation   . Pain   . Acid reflux   . Hypercholesteremia   . Hypothyroid   . CHF (congestive heart failure)   . Bronchitis   . Hyperlipidemia     Past Surgical History  Procedure Laterality Date  . Wrist surgery    . Abdominal hysterectomy      History reviewed. No pertinent family history.  Social History:  reports that she quit smoking about 69 years ago. She has never used smokeless tobacco. She reports that she drinks alcohol. She reports that she does not use illicit drugs.  Allergies: No Known Allergies  Medications:   Results for orders placed during the hospital encounter of 08/19/13 (from the past 48 hour(s))  CBC WITH DIFFERENTIAL     Status: Abnormal   Collection Time    08/19/13  4:44 PM      Result Value Range   WBC 7.0  4.0 - 10.5 K/uL   RBC 4.02  3.87 - 5.11 MIL/uL   Hemoglobin 12.7  12.0 - 15.0 g/dL   HCT 37.6  36.0 - 46.0 %   MCV 93.5  78.0 - 100.0 fL   MCH 31.6  26.0 - 34.0 pg   MCHC 33.8  30.0 - 36.0 g/dL   RDW 14.3  11.5 - 15.5 %   Platelets 132 (*) 150 - 400 K/uL   Neutrophils Relative % 67  43 - 77 %   Neutro Abs 4.7  1.7 - 7.7 K/uL   Lymphocytes Relative 23  12 - 46 %   Lymphs Abs 1.6  0.7 - 4.0 K/uL   Monocytes Relative  9  3 - 12 %   Monocytes Absolute 0.6  0.1 - 1.0 K/uL   Eosinophils Relative 1  0 - 5 %   Eosinophils Absolute 0.1  0.0 - 0.7 K/uL   Basophils Relative 1  0 - 1 %   Basophils Absolute 0.0  0.0 - 0.1 K/uL  COMPREHENSIVE METABOLIC PANEL     Status: Abnormal   Collection Time    08/19/13  4:44 PM      Result Value Range   Sodium 133 (*) 135 - 145 mEq/L   Potassium 4.4  3.5 - 5.1 mEq/L   Chloride 98  96 - 112 mEq/L   CO2 26  19 - 32 mEq/L   Glucose, Bld 111 (*) 70 - 99 mg/dL   BUN 21  6 - 23 mg/dL   Creatinine, Ser 1.13 (*) 0.50 - 1.10 mg/dL   Calcium 9.2  8.4 - 10.5 mg/dL   Total Protein 6.1  6.0 - 8.3 g/dL   Albumin 3.5  3.5 - 5.2 g/dL   AST 30  0 - 37 U/L   ALT 24  0 -   35 U/L   Alkaline Phosphatase 68  39 - 117 U/L   Total Bilirubin 0.5  0.3 - 1.2 mg/dL   GFR calc non Af Amer 41 (*) >90 mL/min   GFR calc Af Amer 48 (*) >90 mL/min   Comment: (NOTE)     The eGFR has been calculated using the CKD EPI equation.     This calculation has not been validated in all clinical situations.     eGFR's persistently <90 mL/min signify possible Chronic Kidney     Disease.  PROTIME-INR     Status: None   Collection Time    08/19/13  4:44 PM      Result Value Range   Prothrombin Time 13.9  11.6 - 15.2 seconds   INR 1.09  0.00 - 1.49  TYPE AND SCREEN     Status: None   Collection Time    08/19/13  4:46 PM      Result Value Range   ABO/RH(D) A POS     Antibody Screen NEG     Sample Expiration 08/22/2013    ABO/RH     Status: None   Collection Time    08/19/13  4:46 PM      Result Value Range   ABO/RH(D) A POS    URINALYSIS, ROUTINE W REFLEX MICROSCOPIC     Status: Abnormal   Collection Time    08/19/13  6:32 PM      Result Value Range   Color, Urine YELLOW  YELLOW   APPearance CLOUDY (*) CLEAR   Specific Gravity, Urine 1.018  1.005 - 1.030   pH 5.5  5.0 - 8.0   Glucose, UA NEGATIVE  NEGATIVE mg/dL   Hgb urine dipstick NEGATIVE  NEGATIVE   Bilirubin Urine NEGATIVE  NEGATIVE    Ketones, ur NEGATIVE  NEGATIVE mg/dL   Protein, ur NEGATIVE  NEGATIVE mg/dL   Urobilinogen, UA 1.0  0.0 - 1.0 mg/dL   Nitrite NEGATIVE  NEGATIVE   Leukocytes, UA MODERATE (*) NEGATIVE  URINE MICROSCOPIC-ADD ON     Status: Abnormal   Collection Time    08/19/13  6:32 PM      Result Value Range   Squamous Epithelial / LPF FEW (*) RARE   WBC, UA 7-10  <3 WBC/hpf   Bacteria, UA MANY (*) RARE  SURGICAL PCR SCREEN     Status: None   Collection Time    08/19/13 11:16 PM      Result Value Range   MRSA, PCR NEGATIVE  NEGATIVE   Staphylococcus aureus NEGATIVE  NEGATIVE   Comment:            The Xpert SA Assay (FDA     approved for NASAL specimens     in patients over 21 years of age),     is one component of     a comprehensive surveillance     program.  Test performance has     been validated by [facility] for patients greater     than or equal to 1 year old.     It is not intended     to diagnose infection nor to     guide or monitor treatment.  URINALYSIS, ROUTINE W REFLEX MICROSCOPIC     Status: Abnormal   Collection Time    08/20/13  2:13 AM      Result Value Range   Color, Urine YELLOW  YELLOW   APPearance CLOUDY (*) CLEAR     Specific Gravity, Urine 1.019  1.005 - 1.030   pH 5.5  5.0 - 8.0   Glucose, UA NEGATIVE  NEGATIVE mg/dL   Hgb urine dipstick NEGATIVE  NEGATIVE   Bilirubin Urine NEGATIVE  NEGATIVE   Ketones, ur 15 (*) NEGATIVE mg/dL   Protein, ur NEGATIVE  NEGATIVE mg/dL   Urobilinogen, UA 1.0  0.0 - 1.0 mg/dL   Nitrite NEGATIVE  NEGATIVE   Leukocytes, UA NEGATIVE  NEGATIVE   Comment: MICROSCOPIC NOT DONE ON URINES WITH NEGATIVE PROTEIN, BLOOD, LEUKOCYTES, NITRITE, OR GLUCOSE <1000 mg/dL.  GLUCOSE, CAPILLARY     Status: None   Collection Time    08/20/13  6:12 AM      Result Value Range   Glucose-Capillary 92  70 - 99 mg/dL    Dg Chest 1 View  08/19/2013   CLINICAL DATA:  Acute intertrochanteric right femoral neck fracture. Preoperative respiratory  evaluation.  EXAM: CHEST - 1 VIEW  COMPARISON:  Two-view chest x-ray 02/01/2013, 07/24/2011, 02/15/2009.  FINDINGS: Cardiac silhouette markedly enlarged but stable. Thoracic aorta atherosclerotic, unchanged. Large hiatal hernia, unchanged. Calcified granuloma in the right mid lung, unchanged. Lungs otherwise clear. No localized airspace consolidation. No pleural effusions. No pneumothorax. Normal pulmonary vascularity. Left shoulder arthroplasty with anatomic alignment.  IMPRESSION: 1. Stable marked cardiomegaly.  No acute cardiopulmonary disease. 2. Large hiatal hernia.   Electronically Signed   By: Thomas  Lawrence M.D.   On: 08/19/2013 16:26   Dg Hip Complete Right  08/19/2013   CLINICAL DATA:  Fell and injured right hip earlier today.  EXAM: RIGHT HIP - COMPLETE 2+ VIEW  COMPARISON:  None.  FINDINGS: Acute nondisplaced intertrochanteric right femoral neck fracture. Moderate axial joint space narrowing.  Included AP pelvis demonstrates symmetric moderate axial joint space narrowing in the left hip. Dystrophic calcification in the soft tissues below the left inferior pubic ramus. Sacroiliac joints intact with degenerative changes. Symphysis pubis intact. Degenerative changes involving the visualized lower lumbar spine.  IMPRESSION: 1. Acute nondisplaced intertrochanteric right femoral neck fracture. 2. Symmetric moderate osteoarthritis in both hips.   Electronically Signed   By: Thomas  Lawrence M.D.   On: 08/19/2013 16:24    ROS Blood pressure 137/74, pulse 87, temperature 98.5 F (36.9 C), temperature source Oral, resp. rate 16, SpO2 94.00%. Physical Exam  Assessment/Plan: ORIF right hip fracture at [facility] on 08/20/13  ANTON, M. LINDSEY 08/20/2013, 7:15 AM      

## 2013-08-20 NOTE — Transfer of Care (Signed)
Immediate Anesthesia Transfer of Care Note  Patient: Yvonne Donovan  Procedure(s) Performed: Procedure(s): INTRAMEDULLARY (IM) NAIL INTERTROCHANTRIC RIGHT HIP (Right)  Patient Location: PACU  Anesthesia Type:General  Level of Consciousness: awake and alert   Airway & Oxygen Therapy: Patient Spontanous Breathing and Patient connected to nasal cannula oxygen  Post-op Assessment: Report given to PACU RN, Post -op Vital signs reviewed and stable and Patient moving all extremities X 4  Post vital signs: Reviewed and stable  Complications: No apparent anesthesia complications

## 2013-08-20 NOTE — Anesthesia Preprocedure Evaluation (Addendum)
Anesthesia Evaluation  Patient identified by MRN, date of birth, ID band Patient awake    Reviewed: Allergy & Precautions, H&P , NPO status , Patient's Chart, lab work & pertinent test results, reviewed documented beta blocker date and time   Airway Mallampati: II TM Distance: >3 FB Neck ROM: full    Dental  (+) Dental Advisory Given and Teeth Intact   Pulmonary pneumonia -, resolved, former smoker,  breath sounds clear to auscultation        Cardiovascular hypertension, On Medications +CHF + dysrhythmias Atrial Fibrillation Rhythm:regular     Neuro/Psych negative neurological ROS  negative psych ROS   GI/Hepatic Neg liver ROS, GERD-  Medicated and Controlled,  Endo/Other  Hypothyroidism   Renal/GU ARFRenal disease  negative genitourinary   Musculoskeletal   Abdominal   Peds  Hematology negative hematology ROS (+)   Anesthesia Other Findings See surgeon's H&P   Reproductive/Obstetrics negative OB ROS                         Anesthesia Physical Anesthesia Plan  ASA: III  Anesthesia Plan: General   Post-op Pain Management:    Induction:   Airway Management Planned: Simple Face Mask  Additional Equipment:   Intra-op Plan:   Post-operative Plan:   Informed Consent: I have reviewed the patients History and Physical, chart, labs and discussed the procedure including the risks, benefits and alternatives for the proposed anesthesia with the patient or authorized representative who has indicated his/her understanding and acceptance.   Dental Advisory Given  Plan Discussed with: CRNA and Surgeon  Anesthesia Plan Comments: (Patient refuses SAB. Somewhat uncooperatived and disoriented. Will proceed with GA CF)       Anesthesia Quick Evaluation

## 2013-08-20 NOTE — Progress Notes (Signed)
[facility] PROGRESS NOTE  Yvonne Donovan ZOX:[id number] DOB: [birth year] DOA: 08/19/2013 PCP: No primary provider on file.  Assessment/Plan: Fall  - likely mechanical  - Ortho following - S/p Surgery on 12/1 Hip fracture, right  - will follow up on orhto recommendations  - analgesia as needed  - PT post op  Acute renal failure  - likely pre renal and secondary to dehydration  - will provide IVF and repeat BMP in AM  Hypothyroidism  - continue synthroid    Code Status: Full Family Communication: Pt in room (indicate person spoken with, relationship, and if by phone, the number) Disposition Plan: Pending   Consultants:  Orthopedic surgery  Procedures:  Right hip surgery s/p fx 08/20/13  Antibiotics:  Perioperative Cefazolin  HPI/Subjective: No complaints. Reports feeling well  Objective: Filed Vitals:   08/20/13 0911 08/20/13 0926 08/20/13 0946 08/20/13 0948  BP: 155/72 147/74 128/68   Pulse: 66 72 73   Temp:  97.2 F (36.2 C) 97.3 F (36.3 C)   TempSrc:      Resp: 26 21 18    SpO2: 100% 100% 98% 98%    Intake/Output Summary (Last 24 hours) at 08/20/13 1027 Last data filed at 08/20/13 0926  Gross per 24 hour  Intake    700 ml  Output   1300 ml  Net   -600 ml   There were no vitals filed for this visit.  Exam:   General:  Awake, in nad  Cardiovascular: regular, s1, s2  Respiratory: normal resp effort, no wheezing  Abdomen: soft, nondistended  Musculoskeletal: perfused, no clubbing   Data Reviewed: Basic Metabolic Panel:  Recent Labs Lab 08/19/13 1644  NA 133*  K 4.4  CL 98  CO2 26  GLUCOSE 111*  BUN 21  CREATININE 1.13*  CALCIUM 9.2   Liver Function Tests:  Recent Labs Lab 08/19/13 1644  AST 30  ALT 24  ALKPHOS 68  BILITOT 0.5  PROT 6.1  ALBUMIN 3.5   No results found for this basename: LIPASE, AMYLASE,  in the last 168 hours No results found for this basename: AMMONIA,  in the last 168 hours CBC:  Recent  Labs Lab 08/19/13 1644  WBC 7.0  NEUTROABS 4.7  HGB 12.7  HCT 37.6  MCV 93.5  PLT 132*   Cardiac Enzymes: No results found for this basename: CKTOTAL, CKMB, CKMBINDEX, TROPONINI,  in the last 168 hours BNP (last 3 results) No results found for this basename: PROBNP,  in the last 8760 hours CBG:  Recent Labs Lab 08/20/13 0612  GLUCAP 92    Recent Results (from the past 240 hour(s))  SURGICAL PCR SCREEN     Status: None   Collection Time    08/19/13 11:16 PM      Result Value Range Status   MRSA, PCR NEGATIVE  NEGATIVE Final   Staphylococcus aureus NEGATIVE  NEGATIVE Final   Comment:            The Xpert SA Assay (FDA     approved for NASAL specimens     in patients over 31 years of age),     is one component of     a comprehensive surveillance     program.  Test performance has     been validated by [employer] for patients greater     than or equal to 36 year old.     It is not intended     to diagnose infection  nor to     guide or monitor treatment.     Studies: Dg Chest 1 View  08/19/2013   CLINICAL DATA:  Acute intertrochanteric right femoral neck fracture. Preoperative respiratory evaluation.  EXAM: CHEST - 1 VIEW  COMPARISON:  Two-view chest x-ray 02/01/2013, 07/24/2011, 02/15/2009.  FINDINGS: Cardiac silhouette markedly enlarged but stable. Thoracic aorta atherosclerotic, unchanged. Large hiatal hernia, unchanged. Calcified granuloma in the right mid lung, unchanged. Lungs otherwise clear. No localized airspace consolidation. No pleural effusions. No pneumothorax. Normal pulmonary vascularity. Left shoulder arthroplasty with anatomic alignment.  IMPRESSION: 1. Stable marked cardiomegaly.  No acute cardiopulmonary disease. 2. Large hiatal hernia.   Electronically Signed   By: Hulan Saas M.D.   On: 08/19/2013 16:26   Dg Hip Complete Right  08/19/2013   CLINICAL DATA:  Larey Seat and injured right hip earlier today.  EXAM: RIGHT HIP - COMPLETE 2+ VIEW   COMPARISON:  None.  FINDINGS: Acute nondisplaced intertrochanteric right femoral neck fracture. Moderate axial joint space narrowing.  Included AP pelvis demonstrates symmetric moderate axial joint space narrowing in the left hip. Dystrophic calcification in the soft tissues below the left inferior pubic ramus. Sacroiliac joints intact with degenerative changes. Symphysis pubis intact. Degenerative changes involving the visualized lower lumbar spine.  IMPRESSION: 1. Acute nondisplaced intertrochanteric right femoral neck fracture. 2. Symmetric moderate osteoarthritis in both hips.   Electronically Signed   By: Hulan Saas M.D.   On: 08/19/2013 16:24   Dg Hip Operative Right  08/20/2013   CLINICAL DATA:  Right hip intertrochanteric fracture, operative fixation  EXAM: DG OPERATIVE RIGHT HIP  TECHNIQUE: A single spot fluoroscopic AP image of the right hip is submitted.  COMPARISON:  08/19/2013  FINDINGS: Spot fluoroscopic intraoperative views demonstrate right femur intra medullary rod and screw fixation through the right hip intertrochanteric fracture. Alignment appears anatomic. Fracture line remains visible along the inferior margin. No acute hardware abnormality.  IMPRESSION: Status post ORIF of the right hip intertrochanteric fracture. No complicating feature.   Electronically Signed   By: Ruel Favors M.D.   On: 08/20/2013 09:00    Scheduled Meds: . [START ON 08/21/2013] aspirin EC  325 mg Oral Q breakfast  .  ceFAZolin (ANCEF) IV  2 g Intravenous Q6H  . citalopram  10 mg Oral Daily  . docusate sodium  100 mg Oral BID  . enoxaparin (LOVENOX) injection  40 mg Subcutaneous Q24H  . famotidine  20 mg Oral Daily  . hydrALAZINE  25 mg Oral TID  . lamoTRIgine  25 mg Oral BID  . levothyroxine  88 mcg Oral QAC breakfast  . lisinopril  5 mg Oral Daily  . multivitamin with minerals  1 tablet Oral Daily  . risperiDONE  0.25 mg Oral QHS  . simvastatin  10 mg Oral q1800   Continuous Infusions: .  sodium chloride 75 mL/hr at 08/19/13 2259    Principal Problem:   Fall Active Problems:   Hypothyroidism   Hip fracture, right   Acute renal failure   Closed right hip fracture  Time spent:  CHIU, STEPHEN K  [facility] Pager [phone]. If 7PM-7AM, please contact night-coverage at [email or web address], password [facility] 08/20/2013, 10:27 AM  LOS: 1 day

## 2013-08-20 NOTE — Preoperative (Signed)
Beta Blockers   Reason not to administer Beta Blockers:Not Applicable 

## 2013-08-20 NOTE — Anesthesia Postprocedure Evaluation (Signed)
Anesthesia Post Note  Patient: Yvonne Donovan  Procedure(s) Performed: Procedure(s) (LRB): INTRAMEDULLARY (IM) NAIL INTERTROCHANTRIC RIGHT HIP (Right)  Anesthesia type: General  Patient location: PACU  Post pain: Pain level controlled  Post assessment: Patient's Cardiovascular Status Stable  Last Vitals:  Filed Vitals:   08/20/13 0946  BP: 128/68  Pulse: 73  Temp: 36.3 C  Resp: 18    Post vital signs: Reviewed and stable  Level of consciousness: alert  Complications: No apparent anesthesia complications

## 2013-08-20 NOTE — Anesthesia Procedure Notes (Signed)
Procedure Name: Intubation Date/Time: 08/20/2013 7:43 AM Performed by: Yvonne Donovan Pre-anesthesia Checklist: Patient identified, Emergency Drugs available, Suction available and Patient being monitored Patient Re-evaluated:Patient Re-evaluated prior to inductionOxygen Delivery Method: Circle system utilized Preoxygenation: Pre-oxygenation with 100% oxygen Intubation Type: IV induction Ventilation: Mask ventilation without difficulty Laryngoscope Size: Miller and 2 Grade View: Grade I Tube type: Oral Tube size: 7.5 mm Number of attempts: 1 Airway Equipment and Method: Stylet Placement Confirmation: ETT inserted through vocal cords under direct vision,  positive ETCO2 and breath sounds checked- equal and bilateral Secured at: 21 cm Tube secured with: Tape Dental Injury: Teeth and Oropharynx as per pre-operative assessment

## 2013-08-20 NOTE — Interval H&P Note (Signed)
History and Physical Interval Note:  08/20/2013 7:24 AM  Yvonne Donovan  has presented today for surgery, with the diagnosis of right intertrochanteric hip fracture  The various methods of treatment have been discussed with the patient and family. After consideration of risks, benefits and other options for treatment, the patient has consented to  Procedure(s): INTRAMEDULLARY (IM) NAIL INTERTROCHANTRIC RIGHT HIP (Right) as a surgical intervention .  The patient's history has been reviewed, patient examined, no change in status, stable for surgery.  I have reviewed the patient's chart and labs.  Questions were answered to the patient's satisfaction.     Cachet Mccutchen F

## 2013-08-21 LAB — CBC
HCT: 23.8 % — ABNORMAL LOW (ref 36.0–46.0)
Hemoglobin: 8.1 g/dL — ABNORMAL LOW (ref 12.0–15.0)
MCH: 31.9 pg (ref 26.0–34.0)
MCV: 93.7 fL (ref 78.0–100.0)
RBC: 2.54 MIL/uL — ABNORMAL LOW (ref 3.87–5.11)

## 2013-08-21 LAB — BASIC METABOLIC PANEL
BUN: 17 mg/dL (ref 6–23)
CO2: 28 mEq/L (ref 19–32)
Calcium: 8 mg/dL — ABNORMAL LOW (ref 8.4–10.5)
Glucose, Bld: 101 mg/dL — ABNORMAL HIGH (ref 70–99)
Sodium: 136 mEq/L (ref 135–145)

## 2013-08-21 LAB — URINE CULTURE: Colony Count: >=100000

## 2013-08-21 NOTE — Progress Notes (Addendum)
Clinical Social Work Department CLINICAL SOCIAL WORK PLACEMENT NOTE 08/21/2013  Patient:  Yvonne Donovan, Yvonne Donovan  Account Number:  [id number] Admit date:  08/19/2013  Clinical Social Worker:  Jetta Lout, Theresia Majors  Date/time:  08/21/2013 03:05 PM  Clinical Social Work is seeking post-discharge placement for this patient at the following level of care:   SKILLED NURSING   (*CSW will update this form in Epic as items are completed)   08/21/2013  Patient/family provided with [facility] Department of Clinical Social Work's list of facilities offering this level of care within the geographic area requested by the patient (or if unable, by the patient's family).  08/21/2013  Patient/family informed of their freedom to choose among providers that offer the needed level of care, that participate in Medicare, Medicaid or managed care program needed by the patient, have an available bed and are willing to accept the patient.  08/21/2013  Patient/family informed of [facility]' ownership interest in [facility], as well as of the fact that they are under no obligation to receive care at this facility.  PASARR submitted to EDS on 07/25/2011 PASARR number received from [employer] on 07/25/2011  FL2 transmitted to all facilities in geographic area requested by pt/family on  08/21/2013 FL2 transmitted to all facilities within larger geographic area on   Patient informed that his/her managed care company has contracts with or will negotiate with  certain facilities, including the following:     Patient/family informed of bed offers received:  08/22/13 Patient chooses bed at [facility] Physician recommends and patient chooses bed at    Patient to be transferred to  on  08/22/13 Patient to be transferred to facility by [facility]  The following physician request were entered in Epic:   Additional Comments: Patient has existing PASARR from 07/25/2011.

## 2013-08-21 NOTE — Progress Notes (Signed)
Subjective: 1 Day Post-Op Procedure(s) (LRB): INTRAMEDULLARY (IM) NAIL INTERTROCHANTRIC RIGHT HIP (Right) Patient reports pain as 2 on 0-10 scale.  Patient appears to be doing well.  Mild pain noted.  No gas or bowel movement as of yet.  Tolerating diet well.  No fever or chills noted.  No dizziness or shortness of breath.  Objective: Vital signs in last 24 hours: Temp:  [97.4 F (36.3 C)-99.1 F (37.3 C)] 98.2 F (36.8 C) (12/14 0547) Pulse Rate:  [75-86] 75 (12/14 0547) Resp:  [17-20] 18 (12/14 0800) BP: (102-110)/(42-79) 110/42 mmHg (12/14 0547) SpO2:  [92 %-99 %] 99 % (12/14 0800) Weight:  [61.689 kg (136 lb)] 61.689 kg (136 lb) (12/14 0604)  Intake/Output from previous day: 12/13 0701 - 12/14 0700 In: 1265 [P.O.:240; I.V.:925; IV Piggyback:100] Out: 1900 [Urine:1750; Blood:150] Intake/Output this shift:     Recent Labs  08/19/13 1644 08/21/13 0435  HGB 12.7 8.1*    Recent Labs  08/19/13 1644 08/21/13 0435  WBC 7.0 7.2  RBC 4.02 2.54*  HCT 37.6 23.8*  PLT 132* 102*    Recent Labs  08/19/13 1644 08/21/13 0435  NA 133* 136  K 4.4 4.4  CL 98 103  CO2 26 28  BUN 21 17  CREATININE 1.13* 0.95  GLUCOSE 111* 101*  CALCIUM 9.2 8.0*    Recent Labs  08/19/13 1644  INR 1.09    Neurologically intact ABD soft Neurovascular intact Sensation intact distally Intact pulses distally Dorsiflexion/Plantar flexion intact Incision: dressing C/D/I and moderate drainage Compartment soft Negative Homan's  Assessment/Plan: 1 Day Post-Op Procedure(s) (LRB): INTRAMEDULLARY (IM) NAIL INTERTROCHANTRIC RIGHT HIP (Right) Advance diet Up with therapy Change dressing Plan according to medicine We will be back on Wednesday to round on [place].  Please let us know if we can answer any questions in the interim.   Gearldine Shown 08/21/2013, 12:45 PM

## 2013-08-21 NOTE — Progress Notes (Signed)
Clinical Social Work Department BRIEF PSYCHOSOCIAL ASSESSMENT 08/21/2013  Patient:  Lead, JON     Account Number:  [id number]     Admit date:  08/19/2013  Clinical Social Worker:  Hendricks Milo  Date/Time:  08/21/2013 02:49 PM  Referred by:  Physician  Date Referred:  08/21/2013 Referred for  SNF Placement   Other Referral:   Interview type:  Family Other interview type:   Clinical Social Worker (CSW) met with patient's daughter-in-law at bedside.    PSYCHOSOCIAL DATA Living Status:  FACILITY Admitted from facility:  [facility] Level of care:  Assisted Living Primary support name:  Ariela Mochizuki [phone] Primary support relationship to patient:  CHILD, ADULT Degree of support available:   Very supportive.    CURRENT CONCERNS  Other Concerns:    SOCIAL WORK ASSESSMENT / PLAN Clinical Child psychotherapist (CSW) met with patient's daughter-in-law Clydie Braun. Clydie Braun reported that patient is a resident at [facility]. Clydie Braun was agreeable to SNF search in [place]. Clydie Braun reported that patient's son Virl Diamond will take over the SNF search this week.   Assessment/plan status:  Psychosocial Support/Ongoing Assessment of Needs Other assessment/ plan:   Information/referral to community resources:   CSW gave [facility] list.    PATIENT'S/FAMILY'S RESPONSE TO PLAN OF CARE: Clydie Braun thanked CSW and said she appreciates everything we do as social workers.

## 2013-08-21 NOTE — Evaluation (Signed)
Physical Therapy Evaluation Patient Details Name: Yvonne Donovan MRN: [id number] DOB: Jan 11, 1920 Today's Date: 08/21/2013 Time: 4098-1191 PT Time Calculation (min): 20 min  PT Assessment / Plan / Recommendation History of Present Illness  Pt admitted with right intertrochanteric hip fracture. Now s/p IM nail.  Clinical Impression  Patient is s/p above surgery resulting in functional limitations due to the deficits listed below (see PT Problem List).  Patient will benefit from skilled PT to increase their independence and safety with mobility to allow discharge to the venue listed below.       PT Assessment  Patient needs continued PT services    Follow Up Recommendations  SNF    Does the patient have the potential to tolerate intense rehabilitation      Barriers to Discharge        Equipment Recommendations  Rolling walker with 5" wheels;3in1 (PT)    Recommendations for Other Services     Frequency Min 3X/week    Precautions / Restrictions Precautions Precautions: Fall Restrictions Weight Bearing Restrictions: Yes RLE Weight Bearing: Weight bearing as tolerated   Pertinent Vitals/Pain Grimace with motion; Pain limiting ability to bear weight through RLE patient repositioned for comfort       Mobility  Bed Mobility Bed Mobility: Sitting - Scoot to Edge of Bed;Supine to Sit Supine to Sit: 1: +2 Total assist;With rails;HOB elevated Supine to Sit: Patient Percentage: 10% Sitting - Scoot to Edge of Bed: 1: +2 Total assist Sitting - Scoot to Edge of Bed: Patient Percentage: 10% Details for Bed Mobility Assistance: Pt able to bring LLE toward EOB but required significant assist with RLE and to support trunk OOB. Use of draw pad to pivot hips around toward EOB.  Transfers Transfers: Sit to Stand;Stand to Sit Sit to Stand: 1: +2 Total assist;From bed;With upper extremity assist Sit to Stand: Patient Percentage: 10% Stand to Sit: 1: +2 Total assist;To chair/3-in-1;With  upper extremity assist Stand to Sit: Patient Percentage: 10% Details for Transfer Assistance: Assist for power up from bed and to obtain upright posture.  Pt unable to tolerate much weight through RLE.  Assist to guide hips during pivot from bed to chair. Ambulation/Gait Ambulation/Gait Assistance: Not tested (comment)    Exercises     PT Diagnosis: Difficulty walking;Acute pain  PT Problem List: Decreased strength;Decreased range of motion;Decreased activity tolerance;Decreased balance;Decreased mobility;Decreased coordination;Decreased cognition;Decreased knowledge of use of DME;Pain PT Treatment Interventions: DME instruction;Gait training;Functional mobility training;Therapeutic activities;Therapeutic exercise;Balance training;Patient/family education     PT Goals(Current goals can be found in the care plan section) Acute Rehab PT Goals Patient Stated Goal: to be independent PT Goal Formulation: With patient Time For Goal Achievement: 09/04/13 Potential to Achieve Goals: Good  Visit Information  Last PT Received On: 08/21/13 Assistance Needed: +2 PT/OT/SLP Co-Evaluation/Treatment: Yes Reason for Co-Treatment: For patient/therapist safety PT goals addressed during session: Mobility/safety with mobility;Balance OT goals addressed during session: ADL's and self-care History of Present Illness: Pt admitted with right intertrochanteric hip fracture. Now s/p IM nail.       Prior Functioning  Home Living Family/patient expects to be discharged to:: Skilled nursing facility Additional Comments: Pt is from [facility]. Prior Function Level of Independence: Independent with assistive device(s) ADL's / Homemaking Assistance Needed: Pt states was independent with her ADLs at [place] and used a cane for mobility.   Communication Communication: No difficulties    Cognition  Cognition Arousal/Alertness: Awake/alert Behavior During Therapy: WFL for tasks assessed/performed Overall  Cognitive Status: Within Functional Limits for  tasks assessed    Extremity/Trunk Assessment Upper Extremity Assessment Upper Extremity Assessment: Defer to OT evaluation Lower Extremity Assessment Lower Extremity Assessment: RLE deficits/detail RLE Deficits / Details: Decr AROM and strength, limited by pain postop RLE: Unable to fully assess due to pain   Balance Balance Balance Assessed: Yes Static Sitting Balance Static Sitting - Balance Support: Feet supported;Left upper extremity supported Static Sitting - Level of Assistance: 4: Min assist Static Sitting - Comment/# of Minutes: Pt leaning heavily on left hip and using LUE for support in order to avoid putting pressure on painful right hip. Sat EOB 5 minutes.  End of Session PT - End of Session Equipment Utilized During Treatment: Gait belt Activity Tolerance: Patient limited by pain Patient left: in chair;with call bell/phone within reach Nurse Communication: Mobility status  GP     Van Clines [facility] [place], [place] [phone]  08/21/2013, 11:13 AM

## 2013-08-21 NOTE — Progress Notes (Signed)
[facility] PROGRESS NOTE  Yvonne Donovan AVW:[id number] DOB: Jul 24, 1920 DOA: 08/19/2013 PCP: No primary provider on file.  Assessment/Plan: Fall  - likely mechanical  - Ortho following - S/p Surgery on 12/1 Hip fracture, right  - will follow up on orhto recommendations  - analgesia as needed  - PT post op  Acute renal failure  - likely pre renal and secondary to dehydration  - resolved with IVF Hypothyroidism  - continue synthroid   Code Status: Full Family Communication: Pt in room (indicate person spoken with, relationship, and if by phone, the number) Disposition Plan: Pending  Consultants:  Orthopedic surgery  Procedures:  Right hip surgery s/p fx 08/20/13  Antibiotics:  Perioperative Cefazolin  HPI/Subjective: No complaints. Reports feeling well  Objective: Filed Vitals:   08/21/13 0400 08/21/13 0405 08/21/13 0547 08/21/13 0604  BP:   110/42   Pulse:   75   Temp:   98.2 F (36.8 C)   TempSrc:      Resp: 18  20   Height:    5\' 5"  (1.651 m)  Weight:    61.689 kg (136 lb)  SpO2: 99% 99% 99%     Intake/Output Summary (Last 24 hours) at 08/21/13 0816 Last data filed at 08/21/13 0500  Gross per 24 hour  Intake   1265 ml  Output    900 ml  Net    365 ml   Filed Weights   08/21/13 0604  Weight: 61.689 kg (136 lb)    Exam:   General:  Awake, in nad  Cardiovascular: regular, s1, s2  Respiratory: normal resp effort, no wheezing  Abdomen: soft, nondistended  Musculoskeletal: perfused, no clubbing   Data Reviewed: Basic Metabolic Panel:  Recent Labs Lab 08/19/13 1644 08/21/13 0435  NA 133* 136  K 4.4 4.4  CL 98 103  CO2 26 28  GLUCOSE 111* 101*  BUN 21 17  CREATININE 1.13* 0.95  CALCIUM 9.2 8.0*   Liver Function Tests:  Recent Labs Lab 08/19/13 1644  AST 30  ALT 24  ALKPHOS 68  BILITOT 0.5  PROT 6.1  ALBUMIN 3.5   No results found for this basename: LIPASE, AMYLASE,  in the last 168 hours No results found for  this basename: AMMONIA,  in the last 168 hours CBC:  Recent Labs Lab 08/19/13 1644 08/21/13 0435  WBC 7.0 7.2  NEUTROABS 4.7  --   HGB 12.7 8.1*  HCT 37.6 23.8*  MCV 93.5 93.7  PLT 132* 102*   Cardiac Enzymes: No results found for this basename: CKTOTAL, CKMB, CKMBINDEX, TROPONINI,  in the last 168 hours BNP (last 3 results) No results found for this basename: PROBNP,  in the last 8760 hours CBG:  Recent Labs Lab 08/20/13 0612  GLUCAP 92    Recent Results (from the past 240 hour(s))  URINE CULTURE     Status: None   Collection Time    08/19/13  6:32 PM      Result Value Range Status   Specimen Description URINE, CATHETERIZED   Final   Special Requests NONE   Final   Culture  Setup Time     Final   Value: 08/20/2013 02:15     Performed at [facility] Count     Final   Value: >=100,000 COLONIES/ML     Performed at [facility]   Culture     Final   Value: VIRIDANS STREPTOCOCCUS     Performed at [facility]   Report Status 08/21/2013 FINAL   Final  SURGICAL PCR SCREEN     Status: None   Collection Time    08/19/13 11:16 PM      Result Value Range Status   MRSA, PCR NEGATIVE  NEGATIVE Final   Staphylococcus aureus NEGATIVE  NEGATIVE Final   Comment:            The Xpert SA Assay (FDA     approved for NASAL specimens     in patients over 8 years of age),     is one component of     a comprehensive surveillance     program.  Test performance has     been validated by [employer] for patients greater     than or equal to 6 year old.     It is not intended     to diagnose infection nor to     guide or monitor treatment.     Studies: Dg Chest 1 View  08/19/2013   CLINICAL DATA:  Acute intertrochanteric right femoral neck fracture. Preoperative respiratory evaluation.  EXAM: CHEST - 1 VIEW  COMPARISON:  Two-view chest x-ray 02/01/2013, 07/24/2011, 02/15/2009.  FINDINGS: Cardiac silhouette markedly enlarged but stable.  Thoracic aorta atherosclerotic, unchanged. Large hiatal hernia, unchanged. Calcified granuloma in the right mid lung, unchanged. Lungs otherwise clear. No localized airspace consolidation. No pleural effusions. No pneumothorax. Normal pulmonary vascularity. Left shoulder arthroplasty with anatomic alignment.  IMPRESSION: 1. Stable marked cardiomegaly.  No acute cardiopulmonary disease. 2. Large hiatal hernia.   Electronically Signed   By: Hulan Saas M.D.   On: 08/19/2013 16:26   Dg Hip Complete Right  08/19/2013   CLINICAL DATA:  Larey Seat and injured right hip earlier today.  EXAM: RIGHT HIP - COMPLETE 2+ VIEW  COMPARISON:  None.  FINDINGS: Acute nondisplaced intertrochanteric right femoral neck fracture. Moderate axial joint space narrowing.  Included AP pelvis demonstrates symmetric moderate axial joint space narrowing in the left hip. Dystrophic calcification in the soft tissues below the left inferior pubic ramus. Sacroiliac joints intact with degenerative changes. Symphysis pubis intact. Degenerative changes involving the visualized lower lumbar spine.  IMPRESSION: 1. Acute nondisplaced intertrochanteric right femoral neck fracture. 2. Symmetric moderate osteoarthritis in both hips.   Electronically Signed   By: Hulan Saas M.D.   On: 08/19/2013 16:24   Dg Hip Operative Right  08/20/2013   CLINICAL DATA:  Right hip intertrochanteric fracture, operative fixation  EXAM: DG OPERATIVE RIGHT HIP  TECHNIQUE: A single spot fluoroscopic AP image of the right hip is submitted.  COMPARISON:  08/19/2013  FINDINGS: Spot fluoroscopic intraoperative views demonstrate right femur intra medullary rod and screw fixation through the right hip intertrochanteric fracture. Alignment appears anatomic. Fracture line remains visible along the inferior margin. No acute hardware abnormality.  IMPRESSION: Status post ORIF of the right hip intertrochanteric fracture. No complicating feature.   Electronically Signed   By:  Ruel Favors M.D.   On: 08/20/2013 09:00   Dg Pelvis Portable  08/20/2013   CLINICAL DATA:  Status post ORIF of the right hip  EXAM: PORTABLE PELVIS 1-2 VIEWS  COMPARISON:  08/19/2013, 08/20/2013  FINDINGS: A medullary rod and compression screw are now seen traversing the intertrochanteric fracture. The fracture fragments are in near anatomic alignment similar to that seen on the previous day. No acute abnormality is noted.  IMPRESSION: Status post ORIF of the proximal right femur.   Electronically Signed  By: Alcide Clever M.D.   On: 08/20/2013 13:31    Scheduled Meds: . aspirin EC  325 mg Oral Q breakfast  . citalopram  10 mg Oral Daily  . docusate sodium  100 mg Oral BID  . enoxaparin (LOVENOX) injection  40 mg Subcutaneous Q24H  . famotidine  20 mg Oral Daily  . feeding supplement (RESOURCE BREEZE)  1 Container Oral TID BM  . hydrALAZINE  25 mg Oral TID  . lamoTRIgine  25 mg Oral BID  . levothyroxine  88 mcg Oral QAC breakfast  . lisinopril  5 mg Oral Daily  . multivitamin with minerals  1 tablet Oral Daily  . risperiDONE  0.25 mg Oral QHS  . simvastatin  10 mg Oral q1800   Continuous Infusions: . sodium chloride 75 mL/hr at 08/19/13 2259    Principal Problem:   Fall Active Problems:   Hypothyroidism   Hip fracture, right   Acute renal failure   Closed right hip fracture  Time spent:  CHIU, STEPHEN K  [facility] Pager [phone]. If 7PM-7AM, please contact night-coverage at [email or web address], password [facility] 08/21/2013, 8:16 AM  LOS: 2 days

## 2013-08-21 NOTE — Op Note (Signed)
NAMECECILEY, Yvonne Donovan              ACCOUNT NO.:  [id number]  MEDICAL RECORD NO.:  [id number]  LOCATION:  5N12C                        FACILITY:  [facility]  PHYSICIAN:  Loreta Ave, M.D. DATE OF BIRTH:  05-05-1920  DATE OF PROCEDURE:  08/20/2013 DATE OF DISCHARGE:  08/19/2013                              OPERATIVE REPORT   PREOPERATIVE DIAGNOSIS:  Two part intertrochanteric fracture, right hip.  POSTOPERATIVE DIAGNOSIS:  Two part intertrochanteric fracture, right hip.  PROCEDURE:  Open reduction and internal fixation utilizing intramedullary gamma nail 10 mm x 380 mm.  A proximal femoral screw with 95 mm locked at the top of the rod.  No distal interlock.  SURGEON:  Loreta Ave, MD  ASSISTANT:  Margarita Rana, MD, present throughout the entire case, necessary for timely completion of procedure.  ANESTHESIA:  General.  BLOOD LOSS:  Minimal.  SPECIMENS:  None.  CULTURES:  None.  COMPLICATIONS:  None.  DRESSINGS:  Soft compressive.  DESCRIPTION OF PROCEDURE:  The patient was brought to the operating room, placed on the operating room table in supine position.  After adequate anesthesia had been obtained, transferred to the fracture table.  Appropriate padding support gentle traction.  Fluoroscopic guidance throughout.  The fracture reduced appropriately.  A small incision above the trochanter.  Skin and subcutaneous tissue divided. Guidewire into the proximal femur.  Overdrilled.  Intramedullary guidewire passed.  Well placed.  After opening up the proximal end, the fracture was then fixed with placing a 10-mm rod well distal. Appropriate position.  Reamed, drilled and fitted with the lag screw up into the femoral head.  Placed posterior inferior.  Locked in place. This construct gave me good stability and support.  Bone stalk was good and I did not need a distal interlock for rotational control.  Good alignment confirmed.  Wound was irrigated.  Injected Marcaine.   Subcutaneous and subcuticular closure.  Sterile compressive dressing applied.  Taken off the fracture table.  Anesthesia reversed.  Brought to the recovery room. Tolerated the surgery well.  No complications.     Loreta Ave, M.D.     DFM/MEDQ  D:  08/20/2013  T:  08/21/2013  Job:  [id number]

## 2013-08-21 NOTE — Evaluation (Signed)
Occupational Therapy Evaluation Patient Details Name: Yvonne Donovan MRN: [id number] DOB: 06/19/1920 Today's Date: 08/21/2013 Time: 7829-5621 OT Time Calculation (min): 21 min  OT Assessment / Plan / Recommendation History of present illness Pt admitted with right intertrochanteric hip fracture. Now s/p IM nail.   Clinical Impression   Pt admitted with above.  Will benefit from acute OT services to address below problem list.  Recommend SNF for d/c planning.    OT Assessment  Patient needs continued OT Services    Follow Up Recommendations  SNF    Barriers to Discharge      Equipment Recommendations  3 in 1 bedside comode    Recommendations for Other Services    Frequency  Min 2X/week    Precautions / Restrictions Precautions Precautions: Fall Restrictions Weight Bearing Restrictions: Yes RLE Weight Bearing: Weight bearing as tolerated   Pertinent Vitals/Pain See vitals    ADL  Eating/Feeding: Performed;Modified independent Where Assessed - Eating/Feeding: Chair Upper Body Bathing: Simulated;Set up Where Assessed - Upper Body Bathing: Unsupported sitting Lower Body Bathing: Simulated;+1 Total assistance Where Assessed - Lower Body Bathing: Unsupported sitting Upper Body Dressing: Performed;Minimal assistance (assist to snap around IV) Where Assessed - Upper Body Dressing: Supported sitting Lower Body Dressing: Performed;+1 Total assistance Where Assessed - Lower Body Dressing: Supported sitting Toilet Transfer: Simulated;+2 Total assistance Toilet Transfer: Patient Percentage: 10% Toilet Transfer Method: Stand Wellsite geologist:  (bed<>recliner) Equipment Used: Gait belt;Rolling walker Transfers/Ambulation Related to ADLs: Pt attempted sit<>stand with RW but unable to obtain full upright posture.  +2 total assist for SPT from bed to recliner without use of RW. ADL Comments: Increased pain with mobility.    OT Diagnosis: Generalized weakness;Acute  pain  OT Problem List: Decreased strength;Decreased activity tolerance;Impaired balance (sitting and/or standing);Decreased knowledge of use of DME or AE OT Treatment Interventions: Self-care/ADL training;DME and/or AE instruction;Therapeutic activities;Patient/family education;Balance training   OT Goals(Current goals can be found in the care plan section) Acute Rehab OT Goals Patient Stated Goal: to be independent OT Goal Formulation: With patient Time For Goal Achievement: 09/04/13 Potential to Achieve Goals: Good  Visit Information  Last OT Received On: 08/21/13 Assistance Needed: +2 PT/OT/SLP Co-Evaluation/Treatment: Yes Reason for Co-Treatment: For patient/therapist safety OT goals addressed during session: ADL's and self-care History of Present Illness: Pt admitted with right intertrochanteric hip fracture. Now s/p IM nail.       Prior Functioning     Home Living Family/patient expects to be discharged to:: Skilled nursing facility Additional Comments: Pt is from [place]. Prior Function Level of Independence: Independent with assistive device(s) ADL's / Homemaking Assistance Needed: Pt states was independent with her ADLs at [place] and used a cane for mobility.   Communication Communication: No difficulties         Vision/Perception Vision - History Baseline Vision: Wears glasses all the time (glasses not room during eval)   Cognition  Cognition Arousal/Alertness: Awake/alert Behavior During Therapy: WFL for tasks assessed/performed Overall Cognitive Status: Within Functional Limits for tasks assessed    Extremity/Trunk Assessment Upper Extremity Assessment Upper Extremity Assessment: Overall WFL for tasks assessed     Mobility Bed Mobility Bed Mobility: Sitting - Scoot to Edge of Bed;Supine to Sit Supine to Sit: 1: +2 Total assist;With rails;HOB elevated Supine to Sit: Patient Percentage: 10% Sitting - Scoot to Edge of Bed: 1: +2 Total assist Sitting  - Scoot to Edge of Bed: Patient Percentage: 10% Details for Bed Mobility Assistance: Pt able to bring LLE toward EOB  but required significant assist with RLE and to support trunk OOB. Use of draw pad to pivot hips around toward EOB.  Transfers Transfers: Sit to Stand;Stand to Sit Sit to Stand: 1: +2 Total assist;From bed;With upper extremity assist Sit to Stand: Patient Percentage: 10% Stand to Sit: 1: +2 Total assist;To chair/3-in-1;With upper extremity assist Stand to Sit: Patient Percentage: 10% Details for Transfer Assistance: Assist for power up from bed and to obtain upright posture.  Pt unable to tolerate much weight through RLE.  Assist to guide hips during pivot from bed to chair.     Exercise     Balance Balance Balance Assessed: Yes Static Sitting Balance Static Sitting - Balance Support: Feet supported;Left upper extremity supported Static Sitting - Level of Assistance: 4: Min assist Static Sitting - Comment/# of Minutes: Pt leaning heavily on left hip and using LUE for support in order to avoid putting pressure on painful right hip. Sat EOB 5 minutes.   End of Session OT - End of Session Equipment Utilized During Treatment: Gait belt;Rolling walker Activity Tolerance: Patient tolerated treatment well Patient left: in chair;with call bell/phone within reach Nurse Communication: Mobility status  GO    08/21/2013 Cipriano Mile OTR/L Pager [phone] Office [phone]  Cipriano Mile 08/21/2013, 10:46 AM

## 2013-08-22 DIAGNOSIS — F411 Generalized anxiety disorder: Secondary | ICD-10-CM

## 2013-08-22 LAB — CBC
HCT: 22.6 % — ABNORMAL LOW (ref 36.0–46.0)
Hemoglobin: 7.7 g/dL — ABNORMAL LOW (ref 12.0–15.0)
MCH: 32.2 pg (ref 26.0–34.0)
MCHC: 34.1 g/dL (ref 30.0–36.0)
RDW: 14.1 % (ref 11.5–15.5)

## 2013-08-22 LAB — BASIC METABOLIC PANEL
BUN: 14 mg/dL (ref 6–23)
Calcium: 8.5 mg/dL (ref 8.4–10.5)
Chloride: 100 mEq/L (ref 96–112)
GFR calc non Af Amer: 73 mL/min — ABNORMAL LOW (ref >90)
Glucose, Bld: 117 mg/dL — ABNORMAL HIGH (ref 70–99)
Sodium: 133 mEq/L — ABNORMAL LOW (ref 135–145)

## 2013-08-22 MED ORDER — TAMSULOSIN HCL 0.4 MG PO CAPS: 0.4000 mg | ORAL_CAPSULE | Freq: Every day | ORAL | Status: AC

## 2013-08-22 MED ORDER — TAMSULOSIN HCL 0.4 MG PO CAPS
0.4000 mg | ORAL_CAPSULE | Freq: Every day | ORAL | Status: DC
  Administered 2013-08-22 – 2013-08-24 (×3): 0.4 mg via ORAL
  Filled 2013-08-22 (×3): qty 1

## 2013-08-22 NOTE — Progress Notes (Signed)
Physical Therapy Treatment Patient Details Name: Yvonne Donovan MRN: [id number] DOB: [birth year] Today's Date: 08/22/2013 Time: 4098-1191 PT Time Calculation (min): 26 min  PT Assessment / Plan / Recommendation  History of Present Illness Pt admitted with right intertrochanteric hip fracture. Now s/p IM nail.   PT Comments   Making slow progress with mobility, transfers, and activity tolerance; Able to tolerate more weight through RLE today, but still not enough for fully upright standing  Daughter-in-law present, and we all discussed what to expect for rehab  Follow Up Recommendations  SNF     Does the patient have the potential to tolerate intense rehabilitation     Barriers to Discharge        Equipment Recommendations  Rolling walker with 5" wheels;3in1 (PT)    Recommendations for Other Services    Frequency Min 3X/week   Progress towards PT Goals Progress towards PT goals: Progressing toward goals (slowly)  Plan Current plan remains appropriate    Precautions / Restrictions Precautions Precautions: Fall Restrictions RLE Weight Bearing: Weight bearing as tolerated   Pertinent Vitals/Pain None at rest; grimace with transfers, though did not rate patient repositioned for comfort RNnotified    Mobility  Bed Mobility Bed Mobility: Sitting - Scoot to Edge of Bed;Supine to Sit Supine to Sit: 1: +2 Total assist;With rails;HOB elevated Supine to Sit: Patient Percentage: 30% Sitting - Scoot to Edge of Bed: 1: +2 Total assist Sitting - Scoot to Edge of Bed: Patient Percentage: 20% Details for Bed Mobility Assistance: Pt able to bring LLE toward EOB but required significant assist with RLE and to support trunk OOB. Use of draw pad to pivot hips around toward EOB.  Transfers Transfers: Sit to Stand;Stand to Sit;Stand Pivot Transfers Sit to Stand: 1: +2 Total assist;From bed;With upper extremity assist Sit to Stand: Patient Percentage: 20% Stand to Sit: 1: +2 Total assist;To  chair/3-in-1;With upper extremity assist Stand to Sit: Patient Percentage: 20% Stand Pivot Transfers: 1: +2 Total assist Stand Pivot Transfers: Patient Percentage: 20% Details for Transfer Assistance: Assist for power up from bed and to obtain upright posture.  Pt unable to tolerate much weight through RLE.  Assist to guide hips during pivot from bed to chair. Better attempt of sit to stand than yesterday, with more WBing RLE, but pt still unable to attain fully upright standing, so opted to basic pivot to chair Ambulation/Gait Ambulation/Gait Assistance: Not tested (comment)    Exercises Total Joint Exercises Quad Sets: AROM;Both;5 reps Heel Slides: AAROM;Right;5 reps   PT Diagnosis:    PT Problem List:   PT Treatment Interventions:     PT Goals (current goals can now be found in the care plan section) Acute Rehab PT Goals Patient Stated Goal: to be independent PT Goal Formulation: With patient Time For Goal Achievement: 09/04/13 Potential to Achieve Goals: Good  Visit Information  Last PT Received On: 08/22/13 Assistance Needed: +2 PT/OT/SLP Co-Evaluation/Treatment: Yes History of Present Illness: Pt admitted with right intertrochanteric hip fracture. Now s/p IM nail.    Subjective Data  Subjective: Agreeable to OOB Patient Stated Goal: to be independent   Cognition  Cognition Arousal/Alertness: Awake/alert Behavior During Therapy: WFL for tasks assessed/performed Overall Cognitive Status: Within Functional Limits for tasks assessed    Balance  Static Sitting Balance Static Sitting - Balance Support: Feet supported;Left upper extremity supported Static Sitting - Level of Assistance: 4: Min assist Static Sitting - Comment/# of Minutes: Pt leaning heavily on left hip and using LUE for support  in order to avoid putting pressure on painful right hip. Sat EOB 5 minutes.  End of Session PT - End of Session Equipment Utilized During Treatment: Gait belt Activity Tolerance:  Patient limited by pain Patient left: in chair;with call bell/phone within reach Nurse Communication: Mobility status   GP     Van Clines [facility] [place], [place] [phone]  08/22/2013, 10:23 AM

## 2013-08-22 NOTE — Care Management Note (Signed)
CARE MANAGEMENT NOTE 08/22/2013  Patient:  Yvonne Donovan,Yvonne Donovan   Account Number:  [id number]  Date Initiated:  08/22/2013  Documentation initiated by:  Vance Peper  Subjective/Objective Assessment:   79 yr old female s/p IM NAILING of right hip.     Action/Plan:   Patient will require shortterm rehab at [facility]. Social worker is aware.   Anticipated DC Date:  08/22/2013   Anticipated DC Plan:  SKILLED NURSING FACILITY  In-house referral  Clinical Social Worker         Choice offered to / List presented to:             Status of service:  Completed, signed off Medicare Important Message given?   (If response is "NO", the following Medicare IM given date fields will be blank) Date Medicare IM given:   Date Additional Medicare IM given:    Discharge Disposition:  SKILLED NURSING FACILITY

## 2013-08-22 NOTE — Progress Notes (Signed)
Patient ID: Yvonne Donovan, female   DOB: 1920/06/16, 79 y.o.   MRN: [id number]  Yvonne Donovan is cleared for discharge from an orthopedic standpoint.  She is to continue with weight bearing as tolerated.  Continue with Aspirin 325mg  for DVT prophylaxis.  She is to follow up with [place] in two weeks.

## 2013-08-22 NOTE — Progress Notes (Signed)
Occupational Therapy Treatment Patient Details Name: Yvonne Donovan MRN: [id number] DOB: April 22, 1920 Today's Date: 08/22/2013 Time: 8657-8469 OT Time Calculation (min): 16 min  OT Assessment / Plan / Recommendation  History of present illness Pt admitted with right intertrochanteric hip fracture. Now s/p IM nail.   OT comments  This 79 yo female making progress slowly. Will benefit from continued OT.  Follow Up Recommendations  SNF       Equipment Recommendations  3 in 1 bedside comode       Frequency Min 2X/week   Progress towards OT Goals Progress towards OT goals: Progressing toward goals  Plan Discharge plan remains appropriate    Precautions / Restrictions Precautions Precautions: Fall Restrictions RLE Weight Bearing: Weight bearing as tolerated   Pertinent Vitals/Pain 9/10; repositioned in bed from 3n1    ADL  Toilet Transfer: +2 Total assistance Toilet Transfer: Patient Percentage: 30% Toilet Transfer Method: Stand pivot Toilet Transfer Equipment: Bedside commode Toileting - Clothing Manipulation and Hygiene: +1 Total assistance Where Assessed - Glass blower/designer Manipulation and Hygiene: Standing Equipment Used: Gait belt;Rolling walker Transfers/Ambulation Related to ADLs: Mod A sit>stand; max A stand>sit (does not try to control desent      OT Goals(current goals can now be found in the care plan section) Acute Rehab OT Goals Patient Stated Goal: to be independent  Visit Information  Last OT Received On: 08/22/13 Assistance Needed: +2 History of Present Illness: Pt admitted with right intertrochanteric hip fracture. Now s/p IM nail.          Cognition  Cognition Arousal/Alertness: Awake/alert Behavior During Therapy: WFL for tasks assessed/performed Overall Cognitive Status: Within Functional Limits for tasks assessed    Mobility  Bed Mobility Bed Mobility: Sit to Supine Sit to Supine: 1: +2 Total assist Sit to Supine: Patient Percentage:  0% Details for Bed Mobility Assistance: Pt able to bring LLE toward EOB but required significant assist with RLE and to support trunk OOB. Use of draw pad to pivot hips around toward EOB.  Transfers Transfers: Sit to Stand;Stand to Sit Sit to Stand: 3: Mod assist;With upper extremity assist;With armrests;From chair/3-in-1 Sit to Stand: Patient Percentage: 20% Stand to Sit: 1: +1 Total assist;Without upper extremity assist;To bed;To chair/3-in-1 Stand to Sit: Patient Percentage: 20% Details for Transfer Assistance: VCs needed for hand placement for both; on both stand to sits pt verbally cued to reach back for surface she was going to sit down on; however she did not attempt to reach back with her arms          End of Session OT - End of Session Equipment Utilized During Treatment: Gait belt;Rolling walker Activity Tolerance: Patient tolerated treatment well Patient left: in bed;with call bell/phone within reach;with bed alarm set    Evette Georges [phone] 08/22/2013, 10:56 AM

## 2013-08-22 NOTE — Discharge Summary (Addendum)
Physician Discharge Summary  Yvonne Donovan KGM:[id number] DOB: Feb 25, 1920 DOA: 08/19/2013  PCP: No primary provider on file.  Admit date: 08/19/2013 Discharge date: 08/24/13  Time spent: 35 minutes  Recommendations for Outpatient Follow-up:  Follow up with PCP in 1-2 weeks Follow up with Orthopedic surgery as scheduled Repeat CBC in 1 week  Discharge Diagnoses:  Principal Problem:   Fall Active Problems:   Hypothyroidism   Hip fracture, right   Acute renal failure   Closed right hip fracture Anemia  Discharge Condition: Improved  Diet recommendation: regular  Filed Weights   08/21/13 0604  Weight: 61.689 kg (136 lb)    History of present illness:  Pt is 79 yo female with HTN, dementia, hypothyroidism, resident of SNF who fell earlier in the day and hurt her right hip. She is unable to provide details of the fall but says as she fell down she landed on her right hip and felt very sharp pain. She denies fevers, chills, numbness or tingling. No similar events in the past. In ED, she was found to have sustained right hip fracture and ortho was consulted. TRH asked to admit to medical floor.   Hospital Course:  Fall  - likely mechanical  - Ortho was consulted and the patient underwent ORIF of the R hip on 12/13 with no reported complications Hip fracture, right  - analgesia was continued as needed  - PT/OT recommends SNF  Acute renal failure  - likely pre renal and secondary to dehydration  - resolved with IVF  Hypothyroidism  - continue synthroid  Anemia  - Pt with suspected post-op anemia and required 3 units of PRBC's that responded well to transfusion  - Would repeat cbc in 1-2 weeks  Urinary retention  - Started flomax 08/22/13  - Repeat bladder scan overnight with about 500cc of urine  - When pt is discharged, would discharge with foley cath and f/u with Urology as an outpatient  Procedures:  ORIF of R hip on 08/20/13  3 units of PRBC transfusion on  08/23/13  Consultations:  Orthopedic surgery  Discharge Exam: Filed Vitals:   08/24/13 0045 08/24/13 0400 08/24/13 0531 08/24/13 1046  BP: 124/46  125/48 125/48  Pulse: 76  74   Temp: 98.3 F (36.8 C)  99 F (37.2 C)   TempSrc: Oral     Resp: 16 16 18    Height:      Weight:      SpO2: 100% 100% 100%     General: Awake, in nad Cardiovascular: regular, s1, s2 Respiratory: normal resp effort, no wheezing  Discharge Instructions      Discharge Orders   Future Orders Complete By Expires   Weight bearing as tolerated  As directed    Questions:     Laterality:     Extremity:         Medication List         aspirin EC 325 MG tablet  Take 1 tablet (325 mg total) by mouth daily.     citalopram 10 MG tablet  Commonly known as:  CELEXA  Take 1 tablet (10 mg total) by mouth daily.     docusate sodium 100 MG capsule  Commonly known as:  COLACE  Take 100 mg by mouth 2 (two) times daily.     hydrALAZINE 25 MG tablet  Commonly known as:  APRESOLINE  Take 25 mg by mouth 3 (three) times daily.     HYDROcodone-acetaminophen 5-325 MG per tablet  Commonly known  as:  NORCO/VICODIN  Take 1-2 tablets by mouth every 6 (six) hours as needed for moderate pain.     lamoTRIgine 25 MG tablet  Commonly known as:  LAMICTAL  Take 25 mg by mouth 2 (two) times daily.     levothyroxine 88 MCG tablet  Commonly known as:  SYNTHROID, LEVOTHROID  Take 88 mcg by mouth daily before breakfast.     lisinopril 5 MG tablet  Commonly known as:  PRINIVIL,ZESTRIL  Take 5 mg by mouth daily.     multivitamin with minerals Tabs tablet  Take 1 tablet by mouth daily.     ranitidine 150 MG capsule  Commonly known as:  ZANTAC  Take 1 capsule (150 mg total) by mouth daily.     risperiDONE 0.25 MG tablet  Commonly known as:  RISPERDAL  Take 0.25 mg by mouth at bedtime.     simvastatin 10 MG tablet  Commonly known as:  ZOCOR  Take 10 mg by mouth every morning.     tamsulosin 0.4 MG Caps  capsule  Commonly known as:  FLOMAX  Take 1 capsule (0.4 mg total) by mouth daily.       No Known Allergies Follow-up Information   Follow up with [facility], MD. Schedule an appointment as soon as possible for a visit in 2 weeks.   Specialty:  Orthopedic Surgery   Contact information:   [address] [place] [address] [phone]       Schedule an appointment as soon as possible for a visit with follow up with PCP in 1-2 weeks.       The results of significant diagnostics from this hospitalization (including imaging, microbiology, ancillary and laboratory) are listed below for reference.    Significant Diagnostic Studies: Dg Chest 1 View  08/19/2013   CLINICAL DATA:  Acute intertrochanteric right femoral neck fracture. Preoperative respiratory evaluation.  EXAM: CHEST - 1 VIEW  COMPARISON:  Two-view chest x-ray 02/01/2013, 07/24/2011, 02/15/2009.  FINDINGS: Cardiac silhouette markedly enlarged but stable. Thoracic aorta atherosclerotic, unchanged. Large hiatal hernia, unchanged. Calcified granuloma in the right mid lung, unchanged. Lungs otherwise clear. No localized airspace consolidation. No pleural effusions. No pneumothorax. Normal pulmonary vascularity. Left shoulder arthroplasty with anatomic alignment.  IMPRESSION: 1. Stable marked cardiomegaly.  No acute cardiopulmonary disease. 2. Large hiatal hernia.   Electronically Signed   By: Hulan Saas M.D.   On: 08/19/2013 16:26   Dg Hip Complete Right  08/19/2013   CLINICAL DATA:  Larey Seat and injured right hip earlier today.  EXAM: RIGHT HIP - COMPLETE 2+ VIEW  COMPARISON:  None.  FINDINGS: Acute nondisplaced intertrochanteric right femoral neck fracture. Moderate axial joint space narrowing.  Included AP pelvis demonstrates symmetric moderate axial joint space narrowing in the left hip. Dystrophic calcification in the soft tissues below the left inferior pubic ramus. Sacroiliac joints intact with  degenerative changes. Symphysis pubis intact. Degenerative changes involving the visualized lower lumbar spine.  IMPRESSION: 1. Acute nondisplaced intertrochanteric right femoral neck fracture. 2. Symmetric moderate osteoarthritis in both hips.   Electronically Signed   By: Hulan Saas M.D.   On: 08/19/2013 16:24   Dg Hip Operative Right  08/20/2013   CLINICAL DATA:  Right hip intertrochanteric fracture, operative fixation  EXAM: DG OPERATIVE RIGHT HIP  TECHNIQUE: A single spot fluoroscopic AP image of the right hip is submitted.  COMPARISON:  08/19/2013  FINDINGS: Spot fluoroscopic intraoperative views demonstrate right femur intra medullary rod and screw fixation through  the right hip intertrochanteric fracture. Alignment appears anatomic. Fracture line remains visible along the inferior margin. No acute hardware abnormality.  IMPRESSION: Status post ORIF of the right hip intertrochanteric fracture. No complicating feature.   Electronically Signed   By: Ruel Favors M.D.   On: 08/20/2013 09:00   Dg Pelvis Portable  08/20/2013   CLINICAL DATA:  Status post ORIF of the right hip  EXAM: PORTABLE PELVIS 1-2 VIEWS  COMPARISON:  08/19/2013, 08/20/2013  FINDINGS: A medullary rod and compression screw are now seen traversing the intertrochanteric fracture. The fracture fragments are in near anatomic alignment similar to that seen on the previous day. No acute abnormality is noted.  IMPRESSION: Status post ORIF of the proximal right femur.   Electronically Signed   By: Alcide Clever M.D.   On: 08/20/2013 13:31    Microbiology: Recent Results (from the past 240 hour(s))  URINE CULTURE     Status: None   Collection Time    08/19/13  6:32 PM      Result Value Range Status   Specimen Description URINE, CATHETERIZED   Final   Special Requests NONE   Final   Culture  Setup Time     Final   Value: 08/20/2013 02:15     Performed at [facility] Count     Final   Value: >=100,000  COLONIES/ML     Performed at [facility]   Culture     Final   Value: VIRIDANS STREPTOCOCCUS     Performed at [facility]   Report Status 08/21/2013 FINAL   Final  SURGICAL PCR SCREEN     Status: None   Collection Time    08/19/13 11:16 PM      Result Value Range Status   MRSA, PCR NEGATIVE  NEGATIVE Final   Staphylococcus aureus NEGATIVE  NEGATIVE Final   Comment:            The Xpert SA Assay (FDA     approved for NASAL specimens     in patients over 56 years of age),     is one component of     a comprehensive surveillance     program.  Test performance has     been validated by [employer] for patients greater     than or equal to 57 year old.     It is not intended     to diagnose infection nor to     guide or monitor treatment.  URINE CULTURE     Status: None   Collection Time    08/20/13  2:13 AM      Result Value Range Status   Specimen Description URINE, CATHETERIZED   Final   Special Requests NONE   Final   Culture  Setup Time     Final   Value: 08/20/2013 11:39     Performed at [facility] Count     Final   Value: >=100,000 COLONIES/ML     Performed at [facility]   Culture     Final   Value: VIRIDANS STREPTOCOCCUS     Performed at [facility]   Report Status 08/21/2013 FINAL   Final     Labs: Basic Metabolic Panel:  Recent Labs Lab 08/19/13 1644 08/21/13 0435 08/22/13 0524  NA 133* 136 133*  K 4.4 4.4 4.3  CL 98 103 100  CO2 26 28  20  GLUCOSE 111* 101* 117*  BUN 21 17 14   CREATININE 1.13* 0.95 0.71  CALCIUM 9.2 8.0* 8.5   Liver Function Tests:  Recent Labs Lab 08/19/13 1644  AST 30  ALT 24  ALKPHOS 68  BILITOT 0.5  PROT 6.1  ALBUMIN 3.5   No results found for this basename: LIPASE, AMYLASE,  in the last 168 hours No results found for this basename: AMMONIA,  in the last 168 hours CBC:  Recent Labs Lab 08/19/13 1644 08/21/13 0435 08/22/13 0524 08/23/13 0445  08/24/13 0413  WBC 7.0 7.2 8.2 5.6 5.7  NEUTROABS 4.7  --   --   --   --   HGB 12.7 8.1* 7.7* 5.7* 10.2*  HCT 37.6 23.8* 22.6* 16.3* 28.9*  MCV 93.5 93.7 94.6 92.6 90.6  PLT 132* 102* 99* 87* 97*   Cardiac Enzymes: No results found for this basename: CKTOTAL, CKMB, CKMBINDEX, TROPONINI,  in the last 168 hours BNP: BNP (last 3 results) No results found for this basename: PROBNP,  in the last 8760 hours CBG:  Recent Labs Lab 08/20/13 0612  GLUCAP 92   Signed:  Lezli Danek K  [facility] 08/24/2013, 1:02 PM

## 2013-08-22 NOTE — Progress Notes (Signed)
Discussed with SW. Still awaiting insurance clearance so discharge is now delayed. Will follow.

## 2013-08-23 ENCOUNTER — Encounter (HOSPITAL_COMMUNITY): Payer: Self-pay | Admitting: Orthopedic Surgery

## 2013-08-23 DIAGNOSIS — J189 Pneumonia, unspecified organism: Secondary | ICD-10-CM

## 2013-08-23 LAB — CBC
Hemoglobin: 5.7 g/dL — CL (ref 12.0–15.0)
MCH: 32.4 pg (ref 26.0–34.0)
MCHC: 35 g/dL (ref 30.0–36.0)
MCV: 92.6 fL (ref 78.0–100.0)
Platelets: 87 10*3/uL — ABNORMAL LOW (ref 150–400)
WBC: 5.6 10*3/uL (ref 4.0–10.5)

## 2013-08-23 LAB — PREPARE RBC (CROSSMATCH)

## 2013-08-23 LAB — IRON AND TIBC: UIBC: 190 ug/dL (ref 125–400)

## 2013-08-23 LAB — ABO/RH: ABO/RH(D): A POS

## 2013-08-23 NOTE — Progress Notes (Signed)
On day shift patient was unable to void on her own and was bladder scanned and they did an I/O at 1600 and removed 750 ml.  On this shift patient was encouraged to drink fluids, a warm compression was applied to her lower abd., running water, and we got patient up OOB to the bsc.  Patient tried with no success to urinate on her own.  At 0030, we did a bladder scan, showed 416 ml.  At 0100, we did I/O catheter and removed 550 ml of urine.  This is second I/O for patient.  Will continue to monitor.

## 2013-08-23 NOTE — Progress Notes (Signed)
PT Cancellation Note  Patient Details Name: Yvonne Donovan MRN: [id number] DOB: [birth year]   Cancelled Treatment:    Reason Eval/Treat Not Completed: Medical issues which prohibited therapy. Pt's Hg is 5.7g/dL and per nursing is to have blood transfusion this morning. Will check back this afternoon, time permitting.   Ruthann Cancer 08/23/2013, 9:00 AM  Ruthann Cancer, PT, DPT [phone]

## 2013-08-23 NOTE — Progress Notes (Signed)
[facility] PROGRESS NOTE  Yvonne Donovan ZOX:[id number] DOB: [birth year] DOA: 08/19/2013 PCP: No primary provider on file.  Assessment/Plan: Fall  - likely mechanical  - Ortho following - S/p Surgery on 12/13 Hip fracture, right  - will follow up on orhto recommendations  - analgesia as needed  - Pending SNF placement  Acute renal failure  - likely pre renal and secondary to dehydration  - resolved with IVF Hypothyroidism  - continued synthroid  Anemia - Pt with suspected post-op anemia that had been stable until this AM - hgb of 5.7, currently receiving PRBC transfusion - Follow CBC  - Will heme check stools and discuss w/ surgery Urinary retention - Started flomax 08/22/13 - Repeat bladder scan overnight with over 550cc of urine - If/when pt is discharged, would discharge with foley cath and f/u with Urology as an outpatient  Code Status: Full Family Communication: Pt in room (indicate person spoken with, relationship, and if by phone, the number) Disposition Plan: Pending  Consultants:  Orthopedic surgery  Procedures:  Right hip surgery s/p fx 08/20/13  Antibiotics:  Perioperative Cefazolin  HPI/Subjective: Noted to be anemic overnight. Pt eager to go to rehab.  Objective: Filed Vitals:   08/22/13 1320 08/22/13 1600 08/22/13 2140 08/23/13 0845  BP: 127/51  130/52 127/57  Pulse:   82 82  Temp: 98.9 F (37.2 C)  97.6 F (36.4 C) 99.1 F (37.3 C)  TempSrc:      Resp: 16 16 18 16   Height:      Weight:      SpO2: 99%  99% 99%    Intake/Output Summary (Last 24 hours) at 08/23/13 0930 Last data filed at 08/23/13 0800  Gross per 24 hour  Intake    600 ml  Output   1425 ml  Net   -825 ml   Filed Weights   08/21/13 0604  Weight: 61.689 kg (136 lb)    Exam:   General:  Awake, in nad  Cardiovascular: regular, s1, s2  Respiratory: normal resp effort, no wheezing  Abdomen: soft, nondistended  Musculoskeletal: perfused, no clubbing    Data Reviewed: Basic Metabolic Panel:  Recent Labs Lab 08/19/13 1644 08/21/13 0435 08/22/13 0524  NA 133* 136 133*  K 4.4 4.4 4.3  CL 98 103 100  CO2 26 28 20   GLUCOSE 111* 101* 117*  BUN 21 17 14   CREATININE 1.13* 0.95 0.71  CALCIUM 9.2 8.0* 8.5   Liver Function Tests:  Recent Labs Lab 08/19/13 1644  AST 30  ALT 24  ALKPHOS 68  BILITOT 0.5  PROT 6.1  ALBUMIN 3.5   No results found for this basename: LIPASE, AMYLASE,  in the last 168 hours No results found for this basename: AMMONIA,  in the last 168 hours CBC:  Recent Labs Lab 08/19/13 1644 08/21/13 0435 08/22/13 0524 08/23/13 0445  WBC 7.0 7.2 8.2 5.6  NEUTROABS 4.7  --   --   --   HGB 12.7 8.1* 7.7* 5.7*  HCT 37.6 23.8* 22.6* 16.3*  MCV 93.5 93.7 94.6 92.6  PLT 132* 102* 99* 87*   Cardiac Enzymes: No results found for this basename: CKTOTAL, CKMB, CKMBINDEX, TROPONINI,  in the last 168 hours BNP (last 3 results) No results found for this basename: PROBNP,  in the last 8760 hours CBG:  Recent Labs Lab 08/20/13 0612  GLUCAP 92    Recent Results (from the past 240 hour(s))  URINE CULTURE     Status: None   Collection  Time    08/19/13  6:32 PM      Result Value Range Status   Specimen Description URINE, CATHETERIZED   Final   Special Requests NONE   Final   Culture  Setup Time     Final   Value: 08/20/2013 02:15     Performed at [facility] Count     Final   Value: >=100,000 COLONIES/ML     Performed at [facility]   Culture     Final   Value: VIRIDANS STREPTOCOCCUS     Performed at [facility]   Report Status 08/21/2013 FINAL   Final  SURGICAL PCR SCREEN     Status: None   Collection Time    08/19/13 11:16 PM      Result Value Range Status   MRSA, PCR NEGATIVE  NEGATIVE Final   Staphylococcus aureus NEGATIVE  NEGATIVE Final   Comment:            The Xpert SA Assay (FDA     approved for NASAL specimens     in patients over 16 years of age),      is one component of     a comprehensive surveillance     program.  Test performance has     been validated by [employer] for patients greater     than or equal to 58 year old.     It is not intended     to diagnose infection nor to     guide or monitor treatment.  URINE CULTURE     Status: None   Collection Time    08/20/13  2:13 AM      Result Value Range Status   Specimen Description URINE, CATHETERIZED   Final   Special Requests NONE   Final   Culture  Setup Time     Final   Value: 08/20/2013 11:39     Performed at [facility] Count     Final   Value: >=100,000 COLONIES/ML     Performed at [facility]   Culture     Final   Value: VIRIDANS STREPTOCOCCUS     Performed at [facility]   Report Status 08/21/2013 FINAL   Final     Studies: No results found.  Scheduled Meds: . aspirin EC  325 mg Oral Q breakfast  . citalopram  10 mg Oral Daily  . docusate sodium  100 mg Oral BID  . enoxaparin (LOVENOX) injection  40 mg Subcutaneous Q24H  . famotidine  20 mg Oral Daily  . feeding supplement (RESOURCE BREEZE)  1 Container Oral TID BM  . hydrALAZINE  25 mg Oral TID  . lamoTRIgine  25 mg Oral BID  . levothyroxine  88 mcg Oral QAC breakfast  . lisinopril  5 mg Oral Daily  . multivitamin with minerals  1 tablet Oral Daily  . risperiDONE  0.25 mg Oral QHS  . simvastatin  10 mg Oral q1800  . tamsulosin  0.4 mg Oral Daily   Continuous Infusions: . sodium chloride 75 mL/hr at 08/23/13 0408    Principal Problem:   Fall Active Problems:   Hypothyroidism   Hip fracture, right   Acute renal failure   Closed right hip fracture  Time spent:  [name]  [facility] Pager [phone]. If 7PM-7AM, please contact night-coverage at [email or web address], password [facility] 08/23/2013, 9:30 AM  LOS:  4 days

## 2013-08-23 NOTE — Progress Notes (Signed)
Pt had only voided 50ml bladder scan foley catheter inserted

## 2013-08-23 NOTE — Progress Notes (Signed)
Critical lab value this morning at 0615, hemoglobin 5.7.  Contacted on-call new orders to type and screen and transfuse PRBC.

## 2013-08-24 LAB — TYPE AND SCREEN
Antibody Screen: NEGATIVE
Unit division: 0
Unit division: 0

## 2013-08-24 LAB — CBC
HCT: 28.9 % — ABNORMAL LOW (ref 36.0–46.0)
Hemoglobin: 10.2 g/dL — ABNORMAL LOW (ref 12.0–15.0)
MCH: 32 pg (ref 26.0–34.0)
MCV: 90.6 fL (ref 78.0–100.0)
RBC: 3.19 MIL/uL — ABNORMAL LOW (ref 3.87–5.11)
WBC: 5.7 10*3/uL (ref 4.0–10.5)

## 2013-08-24 NOTE — Progress Notes (Signed)
Pt d/cd to [facility] per ambulance Staff to D/C NSL Report called to [facility] Stated they would schedule urology appointment for bladder training [phone].Ordered by Dr Rhona Leavens.

## 2013-08-24 NOTE — Progress Notes (Signed)
[facility] PROGRESS NOTE  Yvonne Donovan BJY:[id number] DOB: Mar 16, 1920 DOA: 08/19/2013 PCP: No primary provider on file.  Assessment/Plan: Fall  - likely mechanical  - Ortho following - S/p Surgery on 12/13 Hip fracture, right  - will follow up on orhto recommendations  - analgesia as needed  - Pending SNF placement  Acute renal failure  - likely pre renal and secondary to dehydration  - resolved with IVF Hypothyroidism  - continued synthroid  Anemia - Pt with suspected post-op anemia and required 3 units of PRBC's that responded well to transfusion - Would repeat cbc in 1-2 weeks Urinary retention - Started flomax 08/22/13 - Repeat bladder scan overnight with about 500cc of urine - When pt is discharged, would discharge with foley cath and f/u with Urology as an outpatient  Code Status: Full Family Communication: Pt in room (indicate person spoken with, relationship, and if by phone, the number) Disposition Plan: Pending  Consultants:  Orthopedic surgery  Procedures:  Right hip surgery s/p fx 08/20/13  Antibiotics:  Perioperative Cefazolin  HPI/Subjective: No acute events noted overnight. Pt eager to go to rehab.  Objective: Filed Vitals:   08/24/13 0045 08/24/13 0400 08/24/13 0531 08/24/13 1046  BP: 124/46  125/48 125/48  Pulse: 76  74   Temp: 98.3 F (36.8 C)  99 F (37.2 C)   TempSrc: Oral     Resp: 16 16 18    Height:      Weight:      SpO2: 100% 100% 100%     Intake/Output Summary (Last 24 hours) at 08/24/13 1214 Last data filed at 08/24/13 0500  Gross per 24 hour  Intake   1935 ml  Output    900 ml  Net   1035 ml   Filed Weights   08/21/13 0604  Weight: 61.689 kg (136 lb)    Exam:   General:  Awake, in nad  Cardiovascular: regular, s1, s2  Respiratory: normal resp effort, no wheezing  Abdomen: soft, nondistended  Musculoskeletal: perfused, no clubbing   Data Reviewed: Basic Metabolic Panel:  Recent Labs Lab  08/19/13 1644 08/21/13 0435 08/22/13 0524  NA 133* 136 133*  K 4.4 4.4 4.3  CL 98 103 100  CO2 26 28 20   GLUCOSE 111* 101* 117*  BUN 21 17 14   CREATININE 1.13* 0.95 0.71  CALCIUM 9.2 8.0* 8.5   Liver Function Tests:  Recent Labs Lab 08/19/13 1644  AST 30  ALT 24  ALKPHOS 68  BILITOT 0.5  PROT 6.1  ALBUMIN 3.5   No results found for this basename: LIPASE, AMYLASE,  in the last 168 hours No results found for this basename: AMMONIA,  in the last 168 hours CBC:  Recent Labs Lab 08/19/13 1644 08/21/13 0435 08/22/13 0524 08/23/13 0445 08/24/13 0413  WBC 7.0 7.2 8.2 5.6 5.7  NEUTROABS 4.7  --   --   --   --   HGB 12.7 8.1* 7.7* 5.7* 10.2*  HCT 37.6 23.8* 22.6* 16.3* 28.9*  MCV 93.5 93.7 94.6 92.6 90.6  PLT 132* 102* 99* 87* 97*   Cardiac Enzymes: No results found for this basename: CKTOTAL, CKMB, CKMBINDEX, TROPONINI,  in the last 168 hours BNP (last 3 results) No results found for this basename: PROBNP,  in the last 8760 hours CBG:  Recent Labs Lab 08/20/13 0612  GLUCAP 92    Recent Results (from the past 240 hour(s))  URINE CULTURE     Status: None   Collection Time  08/19/13  6:32 PM      Result Value Range Status   Specimen Description URINE, CATHETERIZED   Final   Special Requests NONE   Final   Culture  Setup Time     Final   Value: 08/20/2013 02:15     Performed at [facility] Count     Final   Value: >=100,000 COLONIES/ML     Performed at [facility]   Culture     Final   Value: VIRIDANS STREPTOCOCCUS     Performed at [facility]   Report Status 08/21/2013 FINAL   Final  SURGICAL PCR SCREEN     Status: None   Collection Time    08/19/13 11:16 PM      Result Value Range Status   MRSA, PCR NEGATIVE  NEGATIVE Final   Staphylococcus aureus NEGATIVE  NEGATIVE Final   Comment:            The Xpert SA Assay (FDA     approved for NASAL specimens     in patients over 39 years of age),     is one component  of     a comprehensive surveillance     program.  Test performance has     been validated by [employer] for patients greater     than or equal to 48 year old.     It is not intended     to diagnose infection nor to     guide or monitor treatment.  URINE CULTURE     Status: None   Collection Time    08/20/13  2:13 AM      Result Value Range Status   Specimen Description URINE, CATHETERIZED   Final   Special Requests NONE   Final   Culture  Setup Time     Final   Value: 08/20/2013 11:39     Performed at [facility] Count     Final   Value: >=100,000 COLONIES/ML     Performed at [facility]   Culture     Final   Value: VIRIDANS STREPTOCOCCUS     Performed at [facility]   Report Status 08/21/2013 FINAL   Final     Studies: No results found.  Scheduled Meds: . aspirin EC  325 mg Oral Q breakfast  . citalopram  10 mg Oral Daily  . docusate sodium  100 mg Oral BID  . famotidine  20 mg Oral Daily  . feeding supplement (RESOURCE BREEZE)  1 Container Oral TID BM  . hydrALAZINE  25 mg Oral TID  . lamoTRIgine  25 mg Oral BID  . levothyroxine  88 mcg Oral QAC breakfast  . lisinopril  5 mg Oral Daily  . multivitamin with minerals  1 tablet Oral Daily  . risperiDONE  0.25 mg Oral QHS  . simvastatin  10 mg Oral q1800  . tamsulosin  0.4 mg Oral Daily   Continuous Infusions: . sodium chloride 75 mL/hr at 08/24/13 0500    Principal Problem:   Fall Active Problems:   Hypothyroidism   Hip fracture, right   Acute renal failure   Closed right hip fracture  Time spent:  Priya Matsen K  [facility] Pager [phone]. If 7PM-7AM, please contact night-coverage at [email or web address], password [facility] 08/24/2013, 12:14 PM  LOS: 5 days

## 2013-08-24 NOTE — Progress Notes (Signed)
Physical Therapy Treatment Patient Details Name: Yvonne Donovan MRN: [id number] DOB: 01-Apr-1920 Today's Date: 08/24/2013 Time: 4098-1191 PT Time Calculation (min): 20 min  PT Assessment / Plan / Recommendation  History of Present Illness Pt admitted with right intertrochanteric hip fracture. Now s/p IM nail.   PT Comments   Pt progressing slowly towards physical therapy goals. Pt was able to perform functional mobility and transfers with decreased assist at times, however there were 2 instances where max assist was required to prevent a fall during stand pivot transfer to recliner.   Follow Up Recommendations  SNF     Does the patient have the potential to tolerate intense rehabilitation     Barriers to Discharge        Equipment Recommendations  Rolling walker with 5" wheels;3in1 (PT)    Recommendations for Other Services    Frequency Min 3X/week   Progress towards PT Goals Progress towards PT goals: Progressing toward goals  Plan Current plan remains appropriate    Precautions / Restrictions Precautions Precautions: Fall Restrictions Weight Bearing Restrictions: Yes RLE Weight Bearing: Weight bearing as tolerated   Pertinent Vitals/Pain Pt does not rate pain on 0-10 scale when asked, and instead states that her "leg hurts".    Mobility  Bed Mobility Bed Mobility: Supine to Sit;Sitting - Scoot to Edge of Bed Supine to Sit: 1: +2 Total assist;With rails;HOB elevated Supine to Sit: Patient Percentage: 50% Sitting - Scoot to Edge of Bed: 1: +2 Total assist Sitting - Scoot to Edge of Bed: Patient Percentage: 50% Details for Bed Mobility Assistance: Pt required assist for RLE support and movement towards EOB. Bed pad used to assist pt to EOB and transition to full sitting.  Transfers Transfers: Sit to Stand;Stand to [employer] Transfers Sit to Stand: 3: Mod assist;From bed;With upper extremity assist Stand to Sit: 4: Min assist;To chair/3-in-1;With upper extremity  assist Stand Pivot Transfers: 1: +2 Total assist Stand Pivot Transfers: Patient Percentage: 40% Details for Transfer Assistance: VC's for hand placement and safety awareness with the RW. Pt with knee buckling during SPT to recliner, and required heavy mod to max assist to prevent fall. Pt saw recliner close to her after that and began to sit without warning. Chair had to be pushed up against her quickly, again for fall prevention. Ambulation/Gait Ambulation/Gait Assistance: Not tested (comment) Stairs: No Wheelchair Mobility Wheelchair Mobility: No    Exercises Total Joint Exercises Quad Sets: 10 reps General Exercises - Lower Extremity Ankle Circles/Pumps: 10 reps Quad Sets: 10 reps Hip ABduction/ADduction: 10 reps   PT Diagnosis:    PT Problem List:   PT Treatment Interventions:     PT Goals (current goals can now be found in the care plan section) Acute Rehab PT Goals Patient Stated Goal: to be independent PT Goal Formulation: With patient Time For Goal Achievement: 09/04/13 Potential to Achieve Goals: Good  Visit Information  Last PT Received On: 08/24/13 Assistance Needed: +2 History of Present Illness: Pt admitted with right intertrochanteric hip fracture. Now s/p IM nail.    Subjective Data  Subjective: Agreeable to OOB Patient Stated Goal: to be independent   Cognition  Cognition Arousal/Alertness: Awake/alert Behavior During Therapy: WFL for tasks assessed/performed Overall Cognitive Status: Within Functional Limits for tasks assessed    Balance  Balance Balance Assessed: Yes Static Sitting Balance Static Sitting - Balance Support: Feet supported;Bilateral upper extremity supported Static Sitting - Level of Assistance: 5: Stand by assistance Static Standing Balance Static Standing - Balance Support:  Bilateral upper extremity supported Static Standing - Level of Assistance: 4: Min assist  End of Session PT - End of Session Equipment Utilized During  Treatment: Gait belt Activity Tolerance: Patient limited by fatigue;Patient limited by pain Patient left: in chair;with call bell/phone within reach Nurse Communication: Mobility status   GP     Ruthann Cancer 08/24/2013, 1:54 PM  Ruthann Cancer, PT, DPT [phone]

## 2013-08-24 NOTE — Progress Notes (Signed)
Subjective: 4 Days Post-Op Procedure(s) (LRB): INTRAMEDULLARY (IM) NAIL INTERTROCHANTRIC RIGHT HIP (Right) Patient reports pain as 2 on 0-10 scale.  Her hgb dropped to 5.7 yesterday, so she was then transfused with PRBC.  Today, she has some weakness due to her hip, but denies any lightheadedness, dizziness, shortness of breath.  She is otherwise doing well.  No complaints about the hip.  Tolerating diet well today.   Has not passed gas or had a bm as of yet.    Objective: Vital signs in last 24 hours: Temp:  [97.3 F (36.3 C)-100.1 F (37.8 C)] 99 F (37.2 C) (12/17 0531) Pulse Rate:  [74-106] 74 (12/17 0531) Resp:  [15-18] 18 (12/17 0531) BP: (112-149)/(46-70) 125/48 mmHg (12/17 0531) SpO2:  [95 %-100 %] 100 % (12/17 0531)  Intake/Output from previous day: 12/16 0701 - 12/17 0700 In: 2175 [P.O.:600; I.V.:900; Blood:675] Out: 900 [Urine:900] Intake/Output this shift:     Recent Labs  08/22/13 0524 08/23/13 0445 08/24/13 0413  HGB 7.7* 5.7* 10.2*    Recent Labs  08/23/13 0445 08/24/13 0413  WBC 5.6 5.7  RBC 1.76* 3.19*  HCT 16.3* 28.9*  PLT 87* 97*    Recent Labs  08/22/13 0524  NA 133*  K 4.3  CL 100  CO2 20  BUN 14  CREATININE 0.71  GLUCOSE 117*  CALCIUM 8.5   No results found for this basename: LABPT, INR,  in the last 72 hours  Neurologically intact ABD soft Neurovascular intact Sensation intact distally Intact pulses distally Dorsiflexion/Plantar flexion intact Incision: moderate drainage Compartment soft Negative Homan's  Assessment/Plan: 4 Days Post-Op Procedure(s) (LRB): INTRAMEDULLARY (IM) NAIL INTERTROCHANTRIC RIGHT HIP (Right) Advance diet Up with therapy Cleared for discharge from an orthopedic standpoint.    Gearldine Shown 08/24/2013, 7:57 AM

## 2013-10-03 ENCOUNTER — Inpatient Hospital Stay (HOSPITAL_COMMUNITY)
Admission: EM | Admit: 2013-10-03 | Discharge: 2013-10-05 | DRG: 480 | Disposition: A | Discharge status: Skilled Nursing Facility | Payer: Medicare Other | Attending: Family Medicine | Admitting: Family Medicine

## 2013-10-03 ENCOUNTER — Emergency Department (HOSPITAL_COMMUNITY): Payer: Medicare Other

## 2013-10-03 ENCOUNTER — Encounter (HOSPITAL_COMMUNITY): Payer: Self-pay | Admitting: Emergency Medicine

## 2013-10-03 ENCOUNTER — Inpatient Hospital Stay (HOSPITAL_COMMUNITY): Payer: Medicare Other

## 2013-10-03 DIAGNOSIS — Z66 Do not resuscitate: Secondary | ICD-10-CM | POA: Diagnosis present

## 2013-10-03 DIAGNOSIS — I4891 Unspecified atrial fibrillation: Secondary | ICD-10-CM | POA: Diagnosis present

## 2013-10-03 DIAGNOSIS — N183 Chronic kidney disease, stage 3 unspecified: Secondary | ICD-10-CM | POA: Diagnosis present

## 2013-10-03 DIAGNOSIS — I4892 Unspecified atrial flutter: Secondary | ICD-10-CM

## 2013-10-03 DIAGNOSIS — I214 Non-ST elevation (NSTEMI) myocardial infarction: Secondary | ICD-10-CM | POA: Diagnosis not present

## 2013-10-03 DIAGNOSIS — Z5900 Homelessness unspecified: Secondary | ICD-10-CM

## 2013-10-03 DIAGNOSIS — F411 Generalized anxiety disorder: Secondary | ICD-10-CM | POA: Diagnosis present

## 2013-10-03 DIAGNOSIS — W19XXXA Unspecified fall, initial encounter: Secondary | ICD-10-CM

## 2013-10-03 DIAGNOSIS — F03918 Unspecified dementia, unspecified severity, with other behavioral disturbance: Secondary | ICD-10-CM | POA: Diagnosis present

## 2013-10-03 DIAGNOSIS — E785 Hyperlipidemia, unspecified: Secondary | ICD-10-CM | POA: Diagnosis present

## 2013-10-03 DIAGNOSIS — W010XXA Fall on same level from slipping, tripping and stumbling without subsequent striking against object, initial encounter: Secondary | ICD-10-CM | POA: Diagnosis present

## 2013-10-03 DIAGNOSIS — R32 Unspecified urinary incontinence: Secondary | ICD-10-CM | POA: Diagnosis present

## 2013-10-03 DIAGNOSIS — I4510 Unspecified right bundle-branch block: Secondary | ICD-10-CM | POA: Diagnosis present

## 2013-10-03 DIAGNOSIS — Z87891 Personal history of nicotine dependence: Secondary | ICD-10-CM

## 2013-10-03 DIAGNOSIS — E7800 Pure hypercholesterolemia, unspecified: Secondary | ICD-10-CM

## 2013-10-03 DIAGNOSIS — Z7982 Long term (current) use of aspirin: Secondary | ICD-10-CM

## 2013-10-03 DIAGNOSIS — I10 Essential (primary) hypertension: Secondary | ICD-10-CM

## 2013-10-03 DIAGNOSIS — S72001A Fracture of unspecified part of neck of right femur, initial encounter for closed fracture: Secondary | ICD-10-CM

## 2013-10-03 DIAGNOSIS — F419 Anxiety disorder, unspecified: Secondary | ICD-10-CM

## 2013-10-03 DIAGNOSIS — Y9210 Unspecified residential institution as the place of occurrence of the external cause: Secondary | ICD-10-CM | POA: Diagnosis present

## 2013-10-03 DIAGNOSIS — K219 Gastro-esophageal reflux disease without esophagitis: Secondary | ICD-10-CM | POA: Diagnosis present

## 2013-10-03 DIAGNOSIS — Z7901 Long term (current) use of anticoagulants: Secondary | ICD-10-CM

## 2013-10-03 DIAGNOSIS — R7989 Other specified abnormal findings of blood chemistry: Secondary | ICD-10-CM

## 2013-10-03 DIAGNOSIS — I129 Hypertensive chronic kidney disease with stage 1 through stage 4 chronic kidney disease, or unspecified chronic kidney disease: Secondary | ICD-10-CM | POA: Diagnosis present

## 2013-10-03 DIAGNOSIS — N179 Acute kidney failure, unspecified: Secondary | ICD-10-CM

## 2013-10-03 DIAGNOSIS — R945 Abnormal results of liver function studies: Secondary | ICD-10-CM

## 2013-10-03 DIAGNOSIS — J189 Pneumonia, unspecified organism: Secondary | ICD-10-CM

## 2013-10-03 DIAGNOSIS — F0391 Unspecified dementia with behavioral disturbance: Secondary | ICD-10-CM | POA: Diagnosis present

## 2013-10-03 DIAGNOSIS — S72009A Fracture of unspecified part of neck of unspecified femur, initial encounter for closed fracture: Secondary | ICD-10-CM

## 2013-10-03 DIAGNOSIS — N390 Urinary tract infection, site not specified: Secondary | ICD-10-CM

## 2013-10-03 DIAGNOSIS — S72309A Unspecified fracture of shaft of unspecified femur, initial encounter for closed fracture: Principal | ICD-10-CM | POA: Diagnosis present

## 2013-10-03 DIAGNOSIS — E039 Hypothyroidism, unspecified: Secondary | ICD-10-CM

## 2013-10-03 DIAGNOSIS — E860 Dehydration: Secondary | ICD-10-CM

## 2013-10-03 DIAGNOSIS — A498 Other bacterial infections of unspecified site: Secondary | ICD-10-CM | POA: Diagnosis present

## 2013-10-03 DIAGNOSIS — I509 Heart failure, unspecified: Secondary | ICD-10-CM

## 2013-10-03 HISTORY — DX: Anemia, unspecified: D64.9

## 2013-10-03 LAB — CBC WITH DIFFERENTIAL/PLATELET
BASOS PCT: 0 % (ref 0–1)
Basophils Absolute: 0 10*3/uL (ref 0.0–0.1)
Eosinophils Absolute: 0.1 10*3/uL (ref 0.0–0.7)
Eosinophils Relative: 1 % (ref 0–5)
HCT: 40.4 % (ref 36.0–46.0)
HEMOGLOBIN: 13.6 g/dL (ref 12.0–15.0)
LYMPHS ABS: 2 10*3/uL (ref 0.7–4.0)
Lymphocytes Relative: 18 % (ref 12–46)
MCH: 32.1 pg (ref 26.0–34.0)
MCHC: 33.7 g/dL (ref 30.0–36.0)
MCV: 95.3 fL (ref 78.0–100.0)
Monocytes Absolute: 0.6 10*3/uL (ref 0.1–1.0)
Monocytes Relative: 5 % (ref 3–12)
NEUTROS ABS: 8.2 10*3/uL — AB (ref 1.7–7.7)
Neutrophils Relative %: 75 % (ref 43–77)
Platelets: 204 10*3/uL (ref 150–400)
RBC: 4.24 MIL/uL (ref 3.87–5.11)
RDW: 14.4 % (ref 11.5–15.5)
WBC: 10.9 10*3/uL — ABNORMAL HIGH (ref 4.0–10.5)

## 2013-10-03 LAB — URINALYSIS, ROUTINE W REFLEX MICROSCOPIC
BILIRUBIN URINE: NEGATIVE
GLUCOSE, UA: NEGATIVE mg/dL
Hgb urine dipstick: NEGATIVE
KETONES UR: NEGATIVE mg/dL
Nitrite: POSITIVE — AB
Protein, ur: NEGATIVE mg/dL
Specific Gravity, Urine: 1.008 (ref 1.005–1.030)
Urobilinogen, UA: 1 mg/dL (ref 0.0–1.0)
pH: 6 (ref 5.0–8.0)

## 2013-10-03 LAB — TYPE AND SCREEN
ABO/RH(D): A POS
Antibody Screen: NEGATIVE

## 2013-10-03 LAB — BASIC METABOLIC PANEL
BUN: 22 mg/dL (ref 6–23)
CO2: 26 mEq/L (ref 19–32)
Calcium: 9.5 mg/dL (ref 8.4–10.5)
Chloride: 100 mEq/L (ref 96–112)
Creatinine, Ser: 0.88 mg/dL (ref 0.50–1.10)
GFR, EST AFRICAN AMERICAN: 65 mL/min — AB (ref >90)
GFR, EST NON AFRICAN AMERICAN: 56 mL/min — AB (ref >90)
Glucose, Bld: 132 mg/dL — ABNORMAL HIGH (ref 70–99)
POTASSIUM: 4.4 meq/L (ref 3.7–5.3)
Sodium: 140 mEq/L (ref 137–147)

## 2013-10-03 LAB — PROTIME-INR
INR: 1.12 (ref 0.00–1.49)
Prothrombin Time: 14.2 seconds (ref 11.6–15.2)

## 2013-10-03 LAB — URINE MICROSCOPIC-ADD ON

## 2013-10-03 MED ORDER — ONDANSETRON HCL 4 MG/2ML IJ SOLN: 4.0000 mg | Freq: Four times a day (QID) | INTRAMUSCULAR | Status: DC | PRN

## 2013-10-03 MED ORDER — CITALOPRAM HYDROBROMIDE 10 MG PO TABS
10.0000 mg | ORAL_TABLET | Freq: Every day | ORAL | Status: DC
  Administered 2013-10-03 – 2013-10-05 (×2): 10 mg via ORAL
  Filled 2013-10-03 (×3): qty 1

## 2013-10-03 MED ORDER — FENTANYL CITRATE 0.05 MG/ML IJ SOLN
50.0000 ug | INTRAMUSCULAR | Status: DC | PRN
  Administered 2013-10-03: 50 ug via INTRAVENOUS
  Filled 2013-10-03: qty 2

## 2013-10-03 MED ORDER — ONDANSETRON HCL 4 MG/2ML IJ SOLN
4.0000 mg | Freq: Once | INTRAMUSCULAR | Status: AC
  Administered 2013-10-03: 4 mg via INTRAVENOUS
  Filled 2013-10-03: qty 2

## 2013-10-03 MED ORDER — HYDRALAZINE HCL 25 MG PO TABS
25.0000 mg | ORAL_TABLET | Freq: Three times a day (TID) | ORAL | Status: DC
  Administered 2013-10-03 – 2013-10-05 (×5): 25 mg via ORAL
  Filled 2013-10-03 (×11): qty 1

## 2013-10-03 MED ORDER — RISPERIDONE 0.25 MG PO TABS
0.2500 mg | ORAL_TABLET | Freq: Every day | ORAL | Status: DC
  Administered 2013-10-03 – 2013-10-05 (×2): 0.25 mg via ORAL
  Filled 2013-10-03 (×4): qty 1

## 2013-10-03 MED ORDER — FAMOTIDINE 20 MG PO TABS
20.0000 mg | ORAL_TABLET | Freq: Every day | ORAL | Status: DC
  Administered 2013-10-03 – 2013-10-05 (×2): 20 mg via ORAL
  Filled 2013-10-03 (×3): qty 1

## 2013-10-03 MED ORDER — SIMVASTATIN 10 MG PO TABS
10.0000 mg | ORAL_TABLET | Freq: Every day | ORAL | Status: DC
  Administered 2013-10-03 – 2013-10-05 (×2): 10 mg via ORAL
  Filled 2013-10-03 (×3): qty 1

## 2013-10-03 MED ORDER — SODIUM CHLORIDE 0.9 % IV SOLN
1000.0000 mL | INTRAVENOUS | Status: DC
  Administered 2013-10-03: 1000 mL via INTRAVENOUS

## 2013-10-03 MED ORDER — TAMSULOSIN HCL 0.4 MG PO CAPS
0.4000 mg | ORAL_CAPSULE | Freq: Every day | ORAL | Status: DC
  Administered 2013-10-03 – 2013-10-05 (×2): 0.4 mg via ORAL
  Filled 2013-10-03 (×3): qty 1

## 2013-10-03 MED ORDER — LISINOPRIL 5 MG PO TABS
5.0000 mg | ORAL_TABLET | Freq: Every morning | ORAL | Status: DC
  Administered 2013-10-03 – 2013-10-05 (×3): 5 mg via ORAL
  Filled 2013-10-03 (×3): qty 1

## 2013-10-03 MED ORDER — CHLORHEXIDINE GLUCONATE CLOTH 2 % EX PADS: 6.0000 | MEDICATED_PAD | Freq: Once | CUTANEOUS | Status: DC

## 2013-10-03 MED ORDER — ACETAMINOPHEN 500 MG PO TABS
1000.0000 mg | ORAL_TABLET | Freq: Once | ORAL | Status: AC
  Administered 2013-10-03: 1000 mg via ORAL
  Filled 2013-10-03: qty 2

## 2013-10-03 MED ORDER — RIVAROXABAN 20 MG PO TABS
20.0000 mg | ORAL_TABLET | Freq: Every morning | ORAL | Status: DC
  Filled 2013-10-03: qty 1

## 2013-10-03 MED ORDER — DEXTROSE 5 % IV SOLN
1.0000 g | Freq: Once | INTRAVENOUS | Status: AC
  Administered 2013-10-03: 1 g via INTRAVENOUS
  Filled 2013-10-03: qty 10

## 2013-10-03 MED ORDER — ASPIRIN EC 325 MG PO TBEC
325.0000 mg | DELAYED_RELEASE_TABLET | Freq: Every day | ORAL | Status: DC
  Administered 2013-10-03: 325 mg via ORAL
  Filled 2013-10-03 (×2): qty 1

## 2013-10-03 MED ORDER — ONDANSETRON HCL 4 MG PO TABS
4.0000 mg | ORAL_TABLET | Freq: Four times a day (QID) | ORAL | Status: DC | PRN
Start: 2013-10-03 — End: 2013-10-05

## 2013-10-03 MED ORDER — HYDROCODONE-ACETAMINOPHEN 5-325 MG PO TABS: 1.0000 | ORAL_TABLET | Freq: Four times a day (QID) | ORAL | Status: DC | PRN

## 2013-10-03 MED ORDER — SODIUM CHLORIDE 0.9 % IV SOLN
INTRAVENOUS | Status: DC
  Administered 2013-10-03 (×2): via INTRAVENOUS
  Administered 2013-10-04: 20 mL/h via INTRAVENOUS
  Administered 2013-10-04: 20 mL via INTRAVENOUS

## 2013-10-03 MED ORDER — LEVOTHYROXINE SODIUM 88 MCG PO TABS
88.0000 ug | ORAL_TABLET | Freq: Every day | ORAL | Status: DC
  Administered 2013-10-03 – 2013-10-05 (×2): 88 ug via ORAL
  Filled 2013-10-03 (×5): qty 1

## 2013-10-03 MED ORDER — ADULT MULTIVITAMIN W/MINERALS CH
1.0000 | ORAL_TABLET | Freq: Every morning | ORAL | Status: DC
  Administered 2013-10-03 – 2013-10-05 (×2): 1 via ORAL
  Filled 2013-10-03 (×3): qty 1

## 2013-10-03 MED ORDER — DOCUSATE SODIUM 100 MG PO CAPS
100.0000 mg | ORAL_CAPSULE | Freq: Two times a day (BID) | ORAL | Status: DC
  Administered 2013-10-03 – 2013-10-05 (×4): 100 mg via ORAL
  Filled 2013-10-03 (×6): qty 1

## 2013-10-03 MED ORDER — LAMOTRIGINE 25 MG PO TABS
25.0000 mg | ORAL_TABLET | Freq: Two times a day (BID) | ORAL | Status: DC
  Administered 2013-10-03 – 2013-10-05 (×4): 25 mg via ORAL
  Filled 2013-10-03 (×8): qty 1

## 2013-10-03 MED ORDER — CHLORHEXIDINE GLUCONATE 4 % EX LIQD
60.0000 mL | Freq: Once | CUTANEOUS | Status: AC
  Administered 2013-10-04: 4 via TOPICAL
  Filled 2013-10-03 (×2): qty 60

## 2013-10-03 MED ORDER — POLYETHYLENE GLYCOL 3350 17 G PO PACK
17.0000 g | PACK | Freq: Every day | ORAL | Status: DC | PRN
  Filled 2013-10-03: qty 1

## 2013-10-03 MED ORDER — DEXTROSE-NACL 5-0.45 % IV SOLN
100.0000 mL/h | INTRAVENOUS | Status: DC
  Administered 2013-10-03: 100 mL/h via INTRAVENOUS

## 2013-10-03 NOTE — H&P (Addendum)
[facility]  History and Physical Dyana Magner L. Link Snuffer, MD Pager [phone] (if 7P to 7A, page night hospitalist on amion.Theona Muhs YQM:[id number] DOB: 05-27-1920 DOA: 10/03/2013  Referring physician: ED  PCP: No primary provider on file.   Chief Complaint: fall   HPI:  Pt presents from SNF after slipping and undergoing a fall. She had undergone ORIF of R hip on 08/20/13. It appears she has refractured the bone around that hardware. Ortho was consulted who [facility] and they will see patient in the morning. [facility] has been consulted to arrange this admission. She is having R hip pain. No other complaints      Review of Systems:  Neg except as noted above   Past Medical History  Diagnosis Date  . Hypertension   . Constipation   . Pain   . Acid reflux   . Hypercholesteremia   . Hypothyroid   . CHF (congestive heart failure)   . Bronchitis   . Hyperlipidemia   . Anemia     Past Surgical History  Procedure Laterality Date  . Wrist surgery    . Abdominal hysterectomy    . Intramedullary (im) nail intertrochanteric Right 08/20/2013    Procedure: INTRAMEDULLARY (IM) NAIL INTERTROCHANTRIC RIGHT HIP;  Surgeon: Loreta Ave, MD;  Location: [facility] OR;  Service: Orthopedics;  Laterality: Right;    Social History:  reports that she quit smoking about 69 years ago. She has never used smokeless tobacco. She reports that she drinks alcohol. She reports that she does not use illicit drugs.  No Known Allergies  No family history on file.   Prior to Admission medications   Medication Sig Start Date End Date Taking? Authorizing Provider  aspirin EC 325 MG tablet Take 1 tablet (325 mg total) by mouth daily. 08/20/13  Yes M. Odelia Gage, PA-C  citalopram (CELEXA) 10 MG tablet Take 1 tablet (10 mg total) by mouth daily. 02/04/13  Yes [name] A [name], MD  docusate sodium (COLACE) 100 MG capsule Take 100 mg by mouth 2 (two) times daily.     Yes  Historical Provider, MD  hydrALAZINE (APRESOLINE) 25 MG tablet Take 25 mg by mouth 3 (three) times daily.   Yes Historical Provider, MD  HYDROcodone-acetaminophen (NORCO/VICODIN) 5-325 MG per tablet Take 1-2 tablets by mouth every 6 (six) hours as needed for moderate pain. 08/20/13  Yes M. Odelia Gage, PA-C  lamoTRIgine (LAMICTAL) 25 MG tablet Take 25 mg by mouth 2 (two) times daily.   Yes Historical Provider, MD  levothyroxine (SYNTHROID, LEVOTHROID) 88 MCG tablet Take 88 mcg by mouth daily before breakfast.   Yes Historical Provider, MD  lisinopril (PRINIVIL,ZESTRIL) 5 MG tablet Take 5 mg by mouth every morning.    Yes Historical Provider, MD  Multiple Vitamin (MULITIVITAMIN WITH MINERALS) TABS Take 1 tablet by mouth every morning.    Yes Historical Provider, MD  ranitidine (ZANTAC) 150 MG capsule Take 1 capsule (150 mg total) by mouth daily. 02/04/13  Yes [name] A [name], MD  risperiDONE (RISPERDAL) 0.25 MG tablet Take 0.25 mg by mouth at bedtime.   Yes Historical Provider, MD  Rivaroxaban (XARELTO) 20 MG TABS tablet Take 20 mg by mouth every morning.   Yes Historical Provider, MD  simvastatin (ZOCOR) 10 MG tablet Take 10 mg by mouth every morning.    Yes Historical Provider, MD  tamsulosin (FLOMAX) 0.4 MG CAPS capsule Take 1 capsule (0.4 mg total) by mouth daily. 08/22/13  Yes  Jerald KiefStephen K Chiu, MD   Physical Exam: Filed Vitals:   10/03/13 0232  BP: 155/74  Pulse: 77  Temp: 98.7 F (37.1 C)  TempSrc: Oral  Resp: 16  SpO2: 98%     General: WF in mild distress   HEENT: MMM, anicteric   Cardiovascular: RRR, no MRG   Respiratory: CTAB, no crackles   Abdomen: soft, NT, ND   Skin: warm, dry   Musculoskeletal: immobile R leg 2/2 pain   Psychiatric: no anxiety, depression   Neurologic: no focal deficits   Wt Readings from Last 3 Encounters:  08/21/13 61.689 kg (136 lb)  08/21/13 61.689 kg (136 lb)  02/03/13 61.689 kg (136 lb)    Labs on Admission:  Basic Metabolic  Panel:  Recent Labs Lab 10/03/13 0309  NA 140  K 4.4  CL 100  CO2 26  GLUCOSE 132*  BUN 22  CREATININE 0.88  CALCIUM 9.5    Liver Function Tests: No results found for this basename: AST, ALT, ALKPHOS, BILITOT, PROT, ALBUMIN,  in the last 168 hours No results found for this basename: LIPASE, AMYLASE,  in the last 168 hours No results found for this basename: AMMONIA,  in the last 168 hours  CBC:  Recent Labs Lab 10/03/13 0309  WBC 10.9*  NEUTROABS 8.2*  HGB 13.6  HCT 40.4  MCV 95.3  PLT 204    Cardiac Enzymes: No results found for this basename: CKTOTAL, CKMB, CKMBINDEX, TROPONINI,  in the last 168 hours  Troponin (Point of Care Test) No results found for this basename: TROPIPOC,  in the last 72 hours  BNP (last 3 results) No results found for this basename: PROBNP,  in the last 8760 hours  CBG: No results found for this basename: GLUCAP,  in the last 168 hours   Radiological Exams on Admission: Dg Chest 1 View  10/03/2013   CLINICAL DATA:  Broken hip  EXAM: CHEST - 1 VIEW  COMPARISON:  08/19/2013  FINDINGS: Degraded by rotation. Prominent cardiac contour. Central vascular prominence. Interstitial prominence. Retrocardiac opacity not excluded. Aortic atherosclerosis. Hiatal hernia. Right lung granuloma. No definite pneumothorax or pleural effusion. Osteopenia and multilevel degenerative changes. Left shoulder arthroplasty.  IMPRESSION: Enlarged cardiac contour with central vascular prominence, similar to prior. Hiatal hernia. Retrocardiac process not excluded.   Electronically Signed   By: Jearld LeschAndrew  DelGaizo M.D.   On: 10/03/2013 04:40   Dg Hip Complete Right  10/03/2013   CLINICAL DATA:  Right hip pain status post fall  EXAM: RIGHT HIP - COMPLETE 2+ VIEW  COMPARISON:  08/20/2013  FINDINGS: Right femur intra medullary rod and proximal femur screw fixation of the intertrochanteric fracture with interval healing/ decreased conspicuity of the transfixed fracture. However,  there is a new comminuted oblique fracture along the mid femoral shaft. There is lateral displacement of the dominant component. The femoral head remains seated within the acetabulum. Suboptimal AP pelvis view limits evaluation for rami or acetabular fractures though none is definitively identified. Surgical clips project over the mid pelvis.  IMPRESSION: Interval mid femoral shaft fracture. Recommend orthopedic consultation.   Electronically Signed   By: Jearld LeschAndrew  DelGaizo M.D.   On: 10/03/2013 04:25       Active Problems:   Hip fracture   Assessment/Plan 1. R hip fracture - oxy prn pain. [facility] Ortho will see in AM. NPO and bedrest until their eval. Will continue xarelto home medication until their decision is made.  Code Status: Full  Family Communication: no family present  Disposition Plan/Anticipated LOS: admit to [facility] , med-surg   Time spent: 30 minutes  Alysia Penna, MD  Internal Medicine Pager [phone] If 7PM-7AM, please contact night-coverage at [email or web address], password [facility] 10/03/2013, 5:46 AM

## 2013-10-03 NOTE — ED Notes (Signed)
Per EMS report: pt from [facility], Pt fell in her nursing home and hurt her right hip.  On arrival to ED, pt is a/o x 4. Pt denies LOC. Pt only c/o of right leg pain.

## 2013-10-03 NOTE — Consult Note (Signed)
ORTHOPAEDIC CONSULTATION  REQUESTING PHYSICIAN: Geradine Girt, DO  Chief Complaint: Left femure fracture  HPI: Yvonne Donovan is a 80 y.o. female who complains of  Pain in L leg  Past Medical History  Diagnosis Date  . Hypertension   . Constipation   . Pain   . Acid reflux   . Hypercholesteremia   . Hypothyroid   . CHF (congestive heart failure)   . Bronchitis   . Hyperlipidemia   . Anemia    Past Surgical History  Procedure Laterality Date  . Wrist surgery    . Abdominal hysterectomy    . Intramedullary (im) nail intertrochanteric Right 08/20/2013    Procedure: INTRAMEDULLARY (IM) NAIL INTERTROCHANTRIC RIGHT HIP;  Surgeon: Ninetta Lights, MD;  Location: [facility];  Service: Orthopedics;  Laterality: Right;   History   Social History  . Marital Status: Widowed    Spouse Name: N/A    Number of Children: N/A  . Years of Education: N/A   Social History Main Topics  . Smoking status: Former Smoker    Quit date: 08/19/1944  . Smokeless tobacco: Never Used  . Alcohol Use: Yes     Comment: " a little wine when I go out to eat"  . Drug Use: No  . Sexual Activity: No   Other Topics Concern  . None   Social History Narrative  . None   History reviewed. No pertinent family history. No Known Allergies Prior to Admission medications   Medication Sig Start Date End Date Taking? Authorizing Provider  aspirin EC 325 MG tablet Take 1 tablet (325 mg total) by mouth daily. 08/20/13  Yes M. Doran Stabler, PA-C  citalopram (CELEXA) 10 MG tablet Take 1 tablet (10 mg total) by mouth daily. 02/04/13  Yes [name] A [name], MD  docusate sodium (COLACE) 100 MG capsule Take 100 mg by mouth 2 (two) times daily.     Yes Historical Provider, MD  hydrALAZINE (APRESOLINE) 25 MG tablet Take 25 mg by mouth 3 (three) times daily.   Yes Historical Provider, MD  HYDROcodone-acetaminophen (NORCO/VICODIN) 5-325 MG per tablet Take 1-2 tablets by mouth every 6 (six) hours as needed for  moderate pain. 08/20/13  Yes M. Doran Stabler, PA-C  lamoTRIgine (LAMICTAL) 25 MG tablet Take 25 mg by mouth 2 (two) times daily.   Yes Historical Provider, MD  levothyroxine (SYNTHROID, LEVOTHROID) 88 MCG tablet Take 88 mcg by mouth daily before breakfast.   Yes Historical Provider, MD  lisinopril (PRINIVIL,ZESTRIL) 5 MG tablet Take 5 mg by mouth every morning.    Yes Historical Provider, MD  Multiple Vitamin (MULITIVITAMIN WITH MINERALS) TABS Take 1 tablet by mouth every morning.    Yes Historical Provider, MD  ranitidine (ZANTAC) 150 MG capsule Take 1 capsule (150 mg total) by mouth daily. 02/04/13  Yes [name] A [name], MD  risperiDONE (RISPERDAL) 0.25 MG tablet Take 0.25 mg by mouth at bedtime.   Yes Historical Provider, MD  Rivaroxaban (XARELTO) 20 MG TABS tablet Take 20 mg by mouth every morning.   Yes Historical Provider, MD  simvastatin (ZOCOR) 10 MG tablet Take 10 mg by mouth every morning.    Yes Historical Provider, MD  tamsulosin (FLOMAX) 0.4 MG CAPS capsule Take 1 capsule (0.4 mg total) by mouth daily. 08/22/13  Yes Donne Hazel, MD   Dg Chest 1 View  10/03/2013   CLINICAL DATA:  Broken hip  EXAM: CHEST - 1 VIEW  COMPARISON:  08/19/2013  FINDINGS:  Degraded by rotation. Prominent cardiac contour. Central vascular prominence. Interstitial prominence. Retrocardiac opacity not excluded. Aortic atherosclerosis. Hiatal hernia. Right lung granuloma. No definite pneumothorax or pleural effusion. Osteopenia and multilevel degenerative changes. Left shoulder arthroplasty.  IMPRESSION: Enlarged cardiac contour with central vascular prominence, similar to prior. Hiatal hernia. Retrocardiac process not excluded.   Electronically Signed   By: Carlos Levering M.D.   On: 10/03/2013 04:40   Dg Hip Complete Right  10/03/2013   CLINICAL DATA:  Right hip pain status post fall  EXAM: RIGHT HIP - COMPLETE 2+ VIEW  COMPARISON:  08/20/2013  FINDINGS: Right femur intra medullary rod and proximal femur screw  fixation of the intertrochanteric fracture with interval healing/ decreased conspicuity of the transfixed fracture. However, there is a new comminuted oblique fracture along the mid femoral shaft. There is lateral displacement of the dominant component. The femoral head remains seated within the acetabulum. Suboptimal AP pelvis view limits evaluation for rami or acetabular fractures though none is definitively identified. Surgical clips project over the mid pelvis.  IMPRESSION: Interval mid femoral shaft fracture. Recommend orthopedic consultation.   Electronically Signed   By: Carlos Levering M.D.   On: 10/03/2013 04:25   Ct Head Wo Contrast  10/03/2013   CLINICAL DATA:  Right hip pain status post unwitnessed fall  EXAM: CT HEAD WITHOUT CONTRAST  CT CERVICAL SPINE WITHOUT CONTRAST  TECHNIQUE: Multidetector CT imaging of the head and cervical spine was performed following the standard protocol without intravenous contrast. Multiplanar CT image reconstructions of the cervical spine were also generated.  COMPARISON:  Noncontrast CT scan of the brain dated October 17, 2011.  FINDINGS: CT HEAD FINDINGS  The ventricles are normal in size and position. There is very mild age-appropriate diffuse atrophy. There is decreased density in the deep white matter of both cerebral hemispheres consistent with chronic small vessel ischemic type change. There is no evidence of an acute intracranial hemorrhage. The cerebellum and brainstem are normal in density.  At bone window settings the observed portions of the paranasal sinuses reveal small amounts of mucoperiosteal thickening and fluid in the right maxillary sinus and within the sphenoid sinuses with mucoperiosteal thickening in the ethmoid sinuses.  CT CERVICAL SPINE FINDINGS  The cervical vertebral bodies are preserved in height. There is an exaggerated cervical lordosis likely due to the patient's severe thoracic kyphosis. There is disc space narrowing at C4-5 and to a  lesser extent at C5-6. The prevertebral soft tissue spaces appear normal. There is no evidence of a perched facet nor spinous process fracture. The odontoid is intact and the lateral masses of C1 align normally with those of C2. The bony ring at each cervical level is intact. No soft tissue hematoma of the visualized portions of the neck is demonstrated. The pulmonary apices are clear. On the lateral film partial compression of the body of T4 is visible.  IMPRESSION: 1. There are stable chronic changes within the brain. There is no evidence of an acute ischemic or hemorrhagic infarction. 2. There are findings within the paranasal sinuses which are not new but which likelyreflect both acute and chronic sinusitis. 3. There is no evidence of an acute cervical spine fracture nor dislocation. There are mild degenerative disc changes in the mid cervical spine. There is partial compression of the body of T4 which was visible on a and lateral chest x-ray dated Feb 01, 2013. Marland Kitchen   Electronically Signed   By: David  Martinique   On: 10/03/2013 08:05  Ct Cervical Spine Wo Contrast  10/03/2013   CLINICAL DATA:  Right hip pain status post unwitnessed fall  EXAM: CT HEAD WITHOUT CONTRAST  CT CERVICAL SPINE WITHOUT CONTRAST  TECHNIQUE: Multidetector CT imaging of the head and cervical spine was performed following the standard protocol without intravenous contrast. Multiplanar CT image reconstructions of the cervical spine were also generated.  COMPARISON:  Noncontrast CT scan of the brain dated October 17, 2011.  FINDINGS: CT HEAD FINDINGS  The ventricles are normal in size and position. There is very mild age-appropriate diffuse atrophy. There is decreased density in the deep white matter of both cerebral hemispheres consistent with chronic small vessel ischemic type change. There is no evidence of an acute intracranial hemorrhage. The cerebellum and brainstem are normal in density.  At bone window settings the observed portions  of the paranasal sinuses reveal small amounts of mucoperiosteal thickening and fluid in the right maxillary sinus and within the sphenoid sinuses with mucoperiosteal thickening in the ethmoid sinuses.  CT CERVICAL SPINE FINDINGS  The cervical vertebral bodies are preserved in height. There is an exaggerated cervical lordosis likely due to the patient's severe thoracic kyphosis. There is disc space narrowing at C4-5 and to a lesser extent at C5-6. The prevertebral soft tissue spaces appear normal. There is no evidence of a perched facet nor spinous process fracture. The odontoid is intact and the lateral masses of C1 align normally with those of C2. The bony ring at each cervical level is intact. No soft tissue hematoma of the visualized portions of the neck is demonstrated. The pulmonary apices are clear. On the lateral film partial compression of the body of T4 is visible.  IMPRESSION: 1. There are stable chronic changes within the brain. There is no evidence of an acute ischemic or hemorrhagic infarction. 2. There are findings within the paranasal sinuses which are not new but which likelyreflect both acute and chronic sinusitis. 3. There is no evidence of an acute cervical spine fracture nor dislocation. There are mild degenerative disc changes in the mid cervical spine. There is partial compression of the body of T4 which was visible on a and lateral chest x-ray dated Feb 01, 2013. Marland Kitchen   Electronically Signed   By: David  Martinique   On: 10/03/2013 08:05    Positive ROS: All other systems have been reviewed and were otherwise negative with the exception of those mentioned in the HPI and as above.  Labs cbc  Recent Labs  10/03/13 0309  WBC 10.9*  HGB 13.6  HCT 40.4  PLT 204    Labs inflam No results found for this basename: ESR, CRP,  in the last 72 hours  Labs coag  Recent Labs  10/03/13 0309  INR 1.12     Recent Labs  10/03/13 0309  NA 140  K 4.4  CL 100  CO2 26  GLUCOSE 132*  BUN  22  CREATININE 0.88  CALCIUM 9.5    Physical Exam: Filed Vitals:   10/03/13 1200  BP: 116/55  Pulse: 93  Temp: 98.3 F (36.8 C)  Resp: 16   General: Alert, no acute distress Cardiovascular: No pedal edema Respiratory: No cyanosis, no use of accessory musculature GI: No organomegaly, abdomen is soft and non-tender Skin: No lesions in the area of chief complaint Neurologic: Sensation intact distally Psychiatric: Patient is competent for consent with normal mood and affect Lymphatic: No axillary or cervical lymphadenopathy  MUSCULOSKELETAL:  NVI distally, pain with ROM, skin benign Other  extremities are atraumatic with painless ROM and NVI.  Assessment: Left femur fracture around an IM nail  Plan: Plan for percutaneous screw fixation of fracture on 1/27.  Weight Bearing Status: Bedrest for now, WBAT post op PT VTE px: SCD's and hold chemical for now, will start post op   Edmonia Lynch, D, MD Cell [phone]   10/03/2013 12:56 PM

## 2013-10-03 NOTE — ED Provider Notes (Signed)
CSN: [id number]     Arrival date & time 10/03/13  0230 History   First MD Initiated Contact with Patient 10/03/13 0231     Chief Complaint  Patient presents with  . Fall  . Hip Pain   (Consider location/radiation/quality/duration/timing/severity/associated sxs/prior Treatment) HPI History provided by EMS. Fall at nursing home complaining of right hip pain. No head injury. No LOC. Patient denies any neck pain. No weakness or numbness. Has pain with any manipulation of her right leg. Pain is sharp moderate severity does not radiate. Patient had right hip repair about a month ago by Dr. Eulah Pont.  Past Medical History  Diagnosis Date  . Hypertension   . Constipation   . Pain   . Acid reflux   . Hypercholesteremia   . Hypothyroid   . CHF (congestive heart failure)   . Bronchitis   . Hyperlipidemia   . Anemia    Past Surgical History  Procedure Laterality Date  . Wrist surgery    . Abdominal hysterectomy    . Intramedullary (im) nail intertrochanteric Right 08/20/2013    Procedure: INTRAMEDULLARY (IM) NAIL INTERTROCHANTRIC RIGHT HIP;  Surgeon: Loreta Ave, MD;  Location: [facility];  Service: Orthopedics;  Laterality: Right;   No family history on file. History  Substance Use Topics  . Smoking status: Former Smoker    Quit date: 08/19/1944  . Smokeless tobacco: Never Used  . Alcohol Use: Yes     Comment: " a little wine when I go out to eat"   OB History   Grav Para Term Preterm Abortions TAB SAB Ect Mult Living                 Review of Systems  All other systems reviewed and are negative.    Allergies  Review of patient's allergies indicates no known allergies.  Home Medications   Current Outpatient Rx  Name  Route  Sig  Dispense  Refill  . aspirin EC 325 MG tablet   Oral   Take 1 tablet (325 mg total) by mouth daily.   30 tablet   0     1 tab a day for the next 30 days to prevent blood  ...   . citalopram (CELEXA) 10 MG tablet   Oral   Take 1 tablet (10  mg total) by mouth daily.   30 tablet   0   . docusate sodium (COLACE) 100 MG capsule   Oral   Take 100 mg by mouth 2 (two) times daily.           . hydrALAZINE (APRESOLINE) 25 MG tablet   Oral   Take 25 mg by mouth 3 (three) times daily.         Marland Kitchen HYDROcodone-acetaminophen (NORCO/VICODIN) 5-325 MG per tablet   Oral   Take 1-2 tablets by mouth every 6 (six) hours as needed for moderate pain.   30 tablet   0   . lamoTRIgine (LAMICTAL) 25 MG tablet   Oral   Take 25 mg by mouth 2 (two) times daily.         Marland Kitchen levothyroxine (SYNTHROID, LEVOTHROID) 88 MCG tablet   Oral   Take 88 mcg by mouth daily before breakfast.         . lisinopril (PRINIVIL,ZESTRIL) 5 MG tablet   Oral   Take 5 mg by mouth every morning.          . Multiple Vitamin (MULITIVITAMIN WITH MINERALS) TABS   Oral  Take 1 tablet by mouth every morning.          . ranitidine (ZANTAC) 150 MG capsule   Oral   Take 1 capsule (150 mg total) by mouth daily.   30 capsule   0   . risperiDONE (RISPERDAL) 0.25 MG tablet   Oral   Take 0.25 mg by mouth at bedtime.         . Rivaroxaban (XARELTO) 20 MG TABS tablet   Oral   Take 20 mg by mouth every morning.         . simvastatin (ZOCOR) 10 MG tablet   Oral   Take 10 mg by mouth every morning.          . tamsulosin (FLOMAX) 0.4 MG CAPS capsule   Oral   Take 1 capsule (0.4 mg total) by mouth daily.   30 capsule   0    BP 155/74  Pulse 77  Temp(Src) 98.7 F (37.1 C) (Oral)  Resp 16  SpO2 98% Physical Exam  Constitutional: She appears well-developed and well-nourished.  HENT:  Head: Normocephalic and atraumatic.  Eyes: EOM are normal. Pupils are equal, round, and reactive to light.  Neck:  No cervical spine tenderness or deformity  Cardiovascular: Normal rate, regular rhythm and intact distal pulses.   Pulmonary/Chest: Effort normal and breath sounds normal. No respiratory distress. She exhibits no tenderness.  Abdominal: Soft. She  exhibits no distension. There is no tenderness.  Musculoskeletal:  Pelvis stable. Tender over the area the right hip and right femur. No obvious deformity. Distal pulses and motor sensorium intact. No tenderness over the knee or ankle.   Neurological: She is alert.  Awake alert and oriented, no unilateral deficits,  Speech is clear  Skin: Skin is warm and dry.    ED Course  Procedures (including critical care time) Labs Review Labs Reviewed  BASIC METABOLIC PANEL - Abnormal; Notable for the following:    Glucose, Bld 132 (*)    GFR calc non Af Amer 56 (*)    GFR calc Af Amer 65 (*)    All other components within normal limits  CBC WITH DIFFERENTIAL - Abnormal; Notable for the following:    WBC 10.9 (*)    Neutro Abs 8.2 (*)    All other components within normal limits  URINALYSIS, ROUTINE W REFLEX MICROSCOPIC - Abnormal; Notable for the following:    APPearance CLOUDY (*)    Nitrite POSITIVE (*)    Leukocytes, UA LARGE (*)    All other components within normal limits  URINE MICROSCOPIC-ADD ON - Abnormal; Notable for the following:    Bacteria, UA MANY (*)    All other components within normal limits  URINE CULTURE  PROTIME-INR  TYPE AND SCREEN   Imaging Review Dg Chest 1 View  10/03/2013   CLINICAL DATA:  Broken hip  EXAM: CHEST - 1 VIEW  COMPARISON:  08/19/2013  FINDINGS: Degraded by rotation. Prominent cardiac contour. Central vascular prominence. Interstitial prominence. Retrocardiac opacity not excluded. Aortic atherosclerosis. Hiatal hernia. Right lung granuloma. No definite pneumothorax or pleural effusion. Osteopenia and multilevel degenerative changes. Left shoulder arthroplasty.  IMPRESSION: Enlarged cardiac contour with central vascular prominence, similar to prior. Hiatal hernia. Retrocardiac process not excluded.   Electronically Signed   By: Jearld LeschAndrew  DelGaizo M.D.   On: 10/03/2013 04:40   Dg Hip Complete Right  10/03/2013   CLINICAL DATA:  Right hip pain status post  fall  EXAM: RIGHT HIP - COMPLETE 2+ VIEW  COMPARISON:  08/20/2013  FINDINGS: Right femur intra medullary rod and proximal femur screw fixation of the intertrochanteric fracture with interval healing/ decreased conspicuity of the transfixed fracture. However, there is a new comminuted oblique fracture along the mid femoral shaft. There is lateral displacement of the dominant component. The femoral head remains seated within the acetabulum. Suboptimal AP pelvis view limits evaluation for rami or acetabular fractures though none is definitively identified. Surgical clips project over the mid pelvis.  IMPRESSION: Interval mid femoral shaft fracture. Recommend orthopedic consultation.   Electronically Signed   By: Jearld Lesch M.D.   On: 10/03/2013 04:25   Rocephin provided for UTI. IV fentanyl pain control. IV fluids.  4:51 AM d/w Gaylord Shih, DR Dion Saucier, requests admit to MED at [facility] Dr. Link Snuffer to arrange for admit at [facility] MED service MDM  Diagnosis: Right femur fracture through intramedullary rod, UTI  Evaluated with labs and imaging reviewed as above. Orthopedic consult and medicine consult/admit IV narcotics pain control IV fluids. N.p.o.    Sunnie Nielsen, MD 10/03/13 [phone]

## 2013-10-03 NOTE — Consult Note (Signed)
Brief Orthopedic consult note:  I have reviewed the xrays and notes.   Full consult pending but tentative plan is for ORIF(essentially percutaneous screw placement) of her Femur fracture on 10/04/12. I have placed pre-op orders and will meet with patient and family and place full c/s note as soon as possible today.    Renaye Rakersim Sherlock Nancarrow [phone]

## 2013-10-03 NOTE — ED Notes (Signed)
Attempted 3 times to put a foley catheter in.  Dr. Dierdre Highmanpitz and Misty StanleyLisa, Charge RN made aware.

## 2013-10-03 NOTE — Progress Notes (Signed)
UR completed. Meet [place] RN CCM Case Mgmt 

## 2013-10-03 NOTE — ED Notes (Signed)
Patient transported to X-ray 

## 2013-10-03 NOTE — ED Notes (Signed)
Bed: [id number] Expected date: 10/03/13 Expected time: 2:18 AM Means of arrival: Ambulance Comments: Fall/hip pain

## 2013-10-03 NOTE — Progress Notes (Addendum)
[facility] PROGRESS NOTE  Yvonne HawkHelen Donovan ZOX:[id number] DOB: 1920/06/16 DOA: 10/03/2013 PCP: No primary provider on file. I have seen and examined pt who is a 80 year old yo from SNF with history of hypertension, CHF, status post right hip or IF on 08/20/13 admitted this am by Dr Link SnufferHolwerda following a fall at the nursing facility and found to have an interval R. mid femoral shaft fracture>> orthopedic consulted and Dr. Eulah PontMurphy to see and plans tentative surgery on 1/27. She denies any pain at this time, also denies shortness of breath. We'll place on a diet and she is to be n.p.o. after midnight. She is awaiting transfer to [facility] today.   Kela MillinVIYUOH,[name]  [facility] Pager [phone]. If 7PM-7AM, please contact night-coverage at [email or web address], password [email or web address] 10/03/2013, 10:30 AM  LOS: 0 days

## 2013-10-04 ENCOUNTER — Encounter (HOSPITAL_COMMUNITY): Payer: Medicare Other | Admitting: Certified Registered"

## 2013-10-04 ENCOUNTER — Inpatient Hospital Stay (HOSPITAL_COMMUNITY): Payer: Medicare Other | Admitting: Certified Registered"

## 2013-10-04 ENCOUNTER — Inpatient Hospital Stay (HOSPITAL_COMMUNITY): Payer: Medicare Other

## 2013-10-04 ENCOUNTER — Encounter (HOSPITAL_COMMUNITY): Payer: Self-pay | Admitting: Certified Registered"

## 2013-10-04 ENCOUNTER — Encounter (HOSPITAL_COMMUNITY)
Admission: EM | Disposition: A | Discharge status: Skilled Nursing Facility | Payer: Self-pay | Source: Home / Self Care | Attending: Family Medicine

## 2013-10-04 DIAGNOSIS — I4892 Unspecified atrial flutter: Secondary | ICD-10-CM

## 2013-10-04 HISTORY — PX: ORIF FEMUR FRACTURE: SHX2119

## 2013-10-04 LAB — BASIC METABOLIC PANEL
BUN: 21 mg/dL (ref 6–23)
CALCIUM: 8.6 mg/dL (ref 8.4–10.5)
CO2: 26 mEq/L (ref 19–32)
CREATININE: 0.88 mg/dL (ref 0.50–1.10)
Chloride: 100 mEq/L (ref 96–112)
GFR calc Af Amer: 65 mL/min — ABNORMAL LOW (ref >90)
GFR calc non Af Amer: 56 mL/min — ABNORMAL LOW (ref >90)
Glucose, Bld: 122 mg/dL — ABNORMAL HIGH (ref 70–99)
Potassium: 4.3 mEq/L (ref 3.7–5.3)
Sodium: 136 mEq/L — ABNORMAL LOW (ref 137–147)

## 2013-10-04 LAB — CBC
HCT: 31 % — ABNORMAL LOW (ref 36.0–46.0)
Hemoglobin: 10.5 g/dL — ABNORMAL LOW (ref 12.0–15.0)
MCH: 32 pg (ref 26.0–34.0)
MCHC: 33.9 g/dL (ref 30.0–36.0)
MCV: 94.5 fL (ref 78.0–100.0)
PLATELETS: 167 10*3/uL (ref 150–400)
RBC: 3.28 MIL/uL — ABNORMAL LOW (ref 3.87–5.11)
RDW: 14.5 % (ref 11.5–15.5)
WBC: 7.6 10*3/uL (ref 4.0–10.5)

## 2013-10-04 LAB — URINE CULTURE: Colony Count: >=100000

## 2013-10-04 LAB — SURGICAL PCR SCREEN
MRSA, PCR: NEGATIVE
Staphylococcus aureus: NEGATIVE

## 2013-10-04 LAB — TROPONIN I
TROPONIN I: 0.3 ng/mL — AB (ref <0.30)
Troponin I: 0.38 ng/mL (ref <0.30)

## 2013-10-04 LAB — TSH: TSH: 4.49 u[IU]/mL (ref 0.350–4.500)

## 2013-10-04 SURGERY — OPEN REDUCTION INTERNAL FIXATION (ORIF) DISTAL FEMUR FRACTURE
Anesthesia: General | Site: Leg Upper | Laterality: Right

## 2013-10-04 MED ORDER — ASPIRIN EC 325 MG PO TBEC
325.0000 mg | DELAYED_RELEASE_TABLET | Freq: Every day | ORAL | Status: DC
  Administered 2013-10-05: 325 mg via ORAL
  Filled 2013-10-04 (×2): qty 1

## 2013-10-04 MED ORDER — FENTANYL CITRATE 0.05 MG/ML IJ SOLN
INTRAMUSCULAR | Status: AC
  Filled 2013-10-04: qty 5

## 2013-10-04 MED ORDER — PHENOL 1.4 % MT LIQD
1.0000 | OROMUCOSAL | Status: DC | PRN
Start: 2013-10-04 — End: 2013-10-05
  Filled 2013-10-04: qty 177

## 2013-10-04 MED ORDER — ASPIRIN 325 MG PO TABS
325.0000 mg | ORAL_TABLET | ORAL | Status: AC
  Administered 2013-10-04: 325 mg via ORAL
  Filled 2013-10-04: qty 1

## 2013-10-04 MED ORDER — BUPIVACAINE HCL (PF) 0.25 % IJ SOLN
INTRAMUSCULAR | Status: AC
  Filled 2013-10-04: qty 30

## 2013-10-04 MED ORDER — GLYCOPYRROLATE 0.2 MG/ML IJ SOLN
INTRAMUSCULAR | Status: DC | PRN
  Administered 2013-10-04: 0.4 mg via INTRAVENOUS

## 2013-10-04 MED ORDER — LACTATED RINGERS IV SOLN
INTRAVENOUS | Status: DC | PRN
  Administered 2013-10-04: 12:00:00 via INTRAVENOUS

## 2013-10-04 MED ORDER — ONDANSETRON HCL 4 MG/2ML IJ SOLN
INTRAMUSCULAR | Status: DC | PRN
  Administered 2013-10-04: 4 mg via INTRAVENOUS

## 2013-10-04 MED ORDER — NEOSTIGMINE METHYLSULFATE 1 MG/ML IJ SOLN
INTRAMUSCULAR | Status: DC | PRN
  Administered 2013-10-04: 3 mg via INTRAVENOUS

## 2013-10-04 MED ORDER — ASPIRIN EC 325 MG PO TBEC: 325.0000 mg | DELAYED_RELEASE_TABLET | Freq: Every day | ORAL | Status: AC

## 2013-10-04 MED ORDER — LIDOCAINE HCL (CARDIAC) 20 MG/ML IV SOLN
INTRAVENOUS | Status: DC | PRN
  Administered 2013-10-04 (×3): 60 mg via INTRAVENOUS

## 2013-10-04 MED ORDER — HEPARIN (PORCINE) IN NACL 100-0.45 UNIT/ML-% IJ SOLN
900.0000 [IU]/h | INTRAMUSCULAR | Status: DC
  Administered 2013-10-04: 750 [IU]/h via INTRAVENOUS
  Filled 2013-10-04 (×2): qty 250

## 2013-10-04 MED ORDER — PHENYLEPHRINE 40 MCG/ML (10ML) SYRINGE FOR IV PUSH (FOR BLOOD PRESSURE SUPPORT)
PREFILLED_SYRINGE | INTRAVENOUS | Status: AC
  Filled 2013-10-04: qty 10

## 2013-10-04 MED ORDER — ROCURONIUM BROMIDE 100 MG/10ML IV SOLN
INTRAVENOUS | Status: DC | PRN
  Administered 2013-10-04: 35 mg via INTRAVENOUS

## 2013-10-04 MED ORDER — HYDROCODONE-ACETAMINOPHEN 5-325 MG PO TABS: 2.0000 | ORAL_TABLET | ORAL | Status: AC | PRN

## 2013-10-04 MED ORDER — ACETAMINOPHEN 650 MG RE SUPP: 650.0000 mg | Freq: Four times a day (QID) | RECTAL | Status: DC | PRN

## 2013-10-04 MED ORDER — FENTANYL CITRATE 0.05 MG/ML IJ SOLN
INTRAMUSCULAR | Status: DC | PRN
  Administered 2013-10-04: 100 ug via INTRAVENOUS

## 2013-10-04 MED ORDER — HEPARIN (PORCINE) IN NACL 100-0.45 UNIT/ML-% IJ SOLN
750.0000 [IU]/h | INTRAMUSCULAR | Status: AC
  Administered 2013-10-04: 750 [IU]/h via INTRAVENOUS
  Filled 2013-10-04: qty 250

## 2013-10-04 MED ORDER — LIDOCAINE HCL 2 % EX GEL
CUTANEOUS | Status: AC
  Filled 2013-10-04: qty 20

## 2013-10-04 MED ORDER — LIDOCAINE HCL (CARDIAC) 20 MG/ML IV SOLN
INTRAVENOUS | Status: AC
  Filled 2013-10-04: qty 5

## 2013-10-04 MED ORDER — ACETAMINOPHEN 325 MG PO TABS: 650.0000 mg | ORAL_TABLET | Freq: Four times a day (QID) | ORAL | Status: DC | PRN

## 2013-10-04 MED ORDER — CEFAZOLIN SODIUM-DEXTROSE 2-3 GM-% IV SOLR
2.0000 g | Freq: Four times a day (QID) | INTRAVENOUS | Status: AC
  Administered 2013-10-04 – 2013-10-05 (×2): 2 g via INTRAVENOUS
  Filled 2013-10-04 (×2): qty 50

## 2013-10-04 MED ORDER — MORPHINE SULFATE 2 MG/ML IJ SOLN
0.5000 mg | INTRAMUSCULAR | Status: DC | PRN
Start: 2013-10-04 — End: 2013-10-05

## 2013-10-04 MED ORDER — 0.9 % SODIUM CHLORIDE (POUR BTL) OPTIME
TOPICAL | Status: DC | PRN
  Administered 2013-10-04: 1000 mL

## 2013-10-04 MED ORDER — MENTHOL 3 MG MT LOZG
1.0000 | LOZENGE | OROMUCOSAL | Status: DC | PRN
  Filled 2013-10-04: qty 9

## 2013-10-04 MED ORDER — PROPOFOL 10 MG/ML IV BOLUS
INTRAVENOUS | Status: DC | PRN
  Administered 2013-10-04: 80 mg via INTRAVENOUS

## 2013-10-04 MED ORDER — PHENYLEPHRINE HCL 10 MG/ML IJ SOLN
INTRAMUSCULAR | Status: DC | PRN
  Administered 2013-10-04 (×3): 80 ug via INTRAVENOUS

## 2013-10-04 MED ORDER — PROPOFOL 10 MG/ML IV BOLUS
INTRAVENOUS | Status: AC
  Filled 2013-10-04: qty 20

## 2013-10-04 SURGICAL SUPPLY — 46 items
BANDAGE ELASTIC 4 VELCRO ST LF (GAUZE/BANDAGES/DRESSINGS) ×3 IMPLANT
BANDAGE ELASTIC 6 VELCRO ST LF (GAUZE/BANDAGES/DRESSINGS) ×3 IMPLANT
BANDAGE GAUZE ELAST BULKY 4 IN (GAUZE/BANDAGES/DRESSINGS) ×3 IMPLANT
BIT DRILL AO GAMMA 4.2X180 (BIT) ×6 IMPLANT
BLADE SURG ROTATE 9660 (MISCELLANEOUS) IMPLANT
CLOTH BEACON ORANGE TIMEOUT ST (SAFETY) ×3 IMPLANT
DRAPE C-ARM 42X72 X-RAY (DRAPES) ×3 IMPLANT
DRAPE C-ARMOR (DRAPES) ×3 IMPLANT
DRAPE ORTHO SPLIT 77X108 STRL (DRAPES) ×4
DRAPE SURG ORHT 6 SPLT 77X108 (DRAPES) ×2 IMPLANT
DRAPE U-SHAPE 47X51 STRL (DRAPES) ×3 IMPLANT
DRSG ADAPTIC 3X8 NADH LF (GAUZE/BANDAGES/DRESSINGS) ×3 IMPLANT
DRSG PAD ABDOMINAL 8X10 ST (GAUZE/BANDAGES/DRESSINGS) ×12 IMPLANT
DRSG TEGADERM 4X4.75 (GAUZE/BANDAGES/DRESSINGS) ×3 IMPLANT
ELECT REM PT RETURN 9FT ADLT (ELECTROSURGICAL) ×3
ELECTRODE REM PT RTRN 9FT ADLT (ELECTROSURGICAL) ×1 IMPLANT
GAUZE SPONGE 2X2 8PLY STRL LF (GAUZE/BANDAGES/DRESSINGS) ×1 IMPLANT
GLOVE BIO SURGEON STRL SZ8 (GLOVE) ×3 IMPLANT
GLOVE BIOGEL PI IND STRL 7.5 (GLOVE) ×1 IMPLANT
GLOVE BIOGEL PI INDICATOR 7.5 (GLOVE) ×2
GOWN PREVENTION PLUS XLARGE (GOWN DISPOSABLE) ×3 IMPLANT
GOWN STRL NON-REIN LRG LVL3 (GOWN DISPOSABLE) ×6 IMPLANT
KIT BASIN OR (CUSTOM PROCEDURE TRAY) ×3 IMPLANT
KIT ROOM TURNOVER OR (KITS) ×3 IMPLANT
MANIFOLD NEPTUNE II (INSTRUMENTS) ×3 IMPLANT
NS IRRIG 1000ML POUR BTL (IV SOLUTION) ×3 IMPLANT
PACK TOTAL JOINT (CUSTOM PROCEDURE TRAY) ×3 IMPLANT
PAD ARMBOARD 7.5X6 YLW CONV (MISCELLANEOUS) ×6 IMPLANT
PAD CAST 4YDX4 CTTN HI CHSV (CAST SUPPLIES) ×1 IMPLANT
PADDING CAST COTTON 4X4 STRL (CAST SUPPLIES) ×2
PADDING CAST COTTON 6X4 STRL (CAST SUPPLIES) ×3 IMPLANT
SCREW LOCKING FULL THREAD 5X52 (Screw) ×3 IMPLANT
SCREW LOCKING T2 F/T  5MMX45MM (Screw) ×2 IMPLANT
SCREW LOCKING T2 F/T 5MMX45MM (Screw) ×1 IMPLANT
SPONGE GAUZE 2X2 STER 10/PKG (GAUZE/BANDAGES/DRESSINGS) ×2
SPONGE GAUZE 4X4 12PLY (GAUZE/BANDAGES/DRESSINGS) ×3 IMPLANT
STAPLER VISISTAT 35W (STAPLE) IMPLANT
SUT MNCRL AB 4-0 PS2 18 (SUTURE) ×3 IMPLANT
SUT MON AB 2-0 CT1 27 (SUTURE) ×3 IMPLANT
SUT VIC AB 0 CT1 27 (SUTURE) ×4
SUT VIC AB 0 CT1 27XBRD ANBCTR (SUTURE) ×2 IMPLANT
TOWEL OR 17X24 6PK STRL BLUE (TOWEL DISPOSABLE) ×3 IMPLANT
TOWEL OR 17X26 10 PK STRL BLUE (TOWEL DISPOSABLE) ×6 IMPLANT
TOWEL OR NON WOVEN STRL DISP B (DISPOSABLE) ×3 IMPLANT
TRAY FOLEY CATH 16FRSI W/METER (SET/KITS/TRAYS/PACK) IMPLANT
WATER STERILE IRR 1000ML POUR (IV SOLUTION) ×6 IMPLANT

## 2013-10-04 NOTE — Anesthesia Preprocedure Evaluation (Signed)
Anesthesia Evaluation  Patient identified by MRN, date of birth, ID band Patient awake    Reviewed: Allergy & Precautions, H&P , NPO status , Patient's Chart, lab work & pertinent test results  Airway Mallampati: II      Dental   Pulmonary pneumonia -, former smoker,          Cardiovascular hypertension, +CHF     Neuro/Psych    GI/Hepatic Neg liver ROS, GERD-  ,  Endo/Other  Hypothyroidism   Renal/GU Renal disease     Musculoskeletal   Abdominal   Peds  Hematology   Anesthesia Other Findings   Reproductive/Obstetrics                           Anesthesia Physical Anesthesia Plan  ASA: III  Anesthesia Plan: General   Post-op Pain Management:    Induction: Intravenous  Airway Management Planned: Oral ETT  Additional Equipment:   Intra-op Plan:   Post-operative Plan: Possible Post-op intubation/ventilation  Informed Consent: I have reviewed the patients History and Physical, chart, labs and discussed the procedure including the risks, benefits and alternatives for the proposed anesthesia with the patient or authorized representative who has indicated his/her understanding and acceptance.   Dental advisory given  Plan Discussed with: CRNA and Anesthesiologist  Anesthesia Plan Comments:         Anesthesia Quick Evaluation

## 2013-10-04 NOTE — Anesthesia Procedure Notes (Signed)
Procedure Name: Intubation Date/Time: 10/04/2013 12:00 PM Performed by: Jerilee HohMUMM, Verl Kitson N Pre-anesthesia Checklist: Patient identified, Emergency Drugs available, Suction available and Patient being monitored Patient Re-evaluated:Patient Re-evaluated prior to inductionOxygen Delivery Method: Circle system utilized Preoxygenation: Pre-oxygenation with 100% oxygen Intubation Type: IV induction Ventilation: Mask ventilation without difficulty Laryngoscope Size: Mac and 3 Grade View: Grade I Tube type: Oral Tube size: 7.5 mm Number of attempts: 1 Airway Equipment and Method: Stylet Placement Confirmation: ETT inserted through vocal cords under direct vision,  positive ETCO2 and breath sounds checked- equal and bilateral Secured at: 21 cm Tube secured with: Tape Dental Injury: Teeth and Oropharynx as per pre-operative assessment

## 2013-10-04 NOTE — Progress Notes (Signed)
  Echocardiogram 2D Echocardiogram has been performed.  Yvonne Donovan, [name] 10/04/2013, 7:07 PM

## 2013-10-04 NOTE — Consult Note (Signed)
Yvonne Donovan is an 80 y.o. female.   Chief Complaint: Atrial fibrillation and possile ischemia HPI: 80 year old female with DNR status underwent right distal femur fracture surgery with open reduction and internal fixation today by Dr. Edmonia Lynch. Patient denies chest pain or shortness of breath.  Past Medical History  Diagnosis Date  . Hypertension   . Constipation   . Pain   . Acid reflux   . Hypercholesteremia   . Hypothyroid   . CHF (congestive heart failure)   . Bronchitis   . Hyperlipidemia   . Anemia       Past Surgical History  Procedure Laterality Date  . Wrist surgery    . Abdominal hysterectomy    . Intramedullary (im) nail intertrochanteric Right 08/20/2013    Procedure: INTRAMEDULLARY (IM) NAIL INTERTROCHANTRIC RIGHT HIP;  Surgeon: Ninetta Lights, MD;  Location: [facility];  Service: Orthopedics;  Laterality: Right;    History reviewed. No pertinent family history. Social History:  reports that she quit smoking about 69 years ago. She has never used smokeless tobacco. She reports that she drinks alcohol. She reports that she does not use illicit drugs.  Allergies: No Known Allergies  Medications Prior to Admission  Medication Sig Dispense Refill  . aspirin EC 325 MG tablet Take 1 tablet (325 mg total) by mouth daily.  30 tablet  0  . citalopram (CELEXA) 10 MG tablet Take 1 tablet (10 mg total) by mouth daily.  30 tablet  0  . docusate sodium (COLACE) 100 MG capsule Take 100 mg by mouth 2 (two) times daily.        . hydrALAZINE (APRESOLINE) 25 MG tablet Take 25 mg by mouth 3 (three) times daily.      Marland Kitchen HYDROcodone-acetaminophen (NORCO/VICODIN) 5-325 MG per tablet Take 1-2 tablets by mouth every 6 (six) hours as needed for moderate pain.  30 tablet  0  . lamoTRIgine (LAMICTAL) 25 MG tablet Take 25 mg by mouth 2 (two) times daily.      Marland Kitchen levothyroxine (SYNTHROID, LEVOTHROID) 88 MCG tablet Take 88 mcg by mouth daily before breakfast.      . lisinopril  (PRINIVIL,ZESTRIL) 5 MG tablet Take 5 mg by mouth every morning.       . Multiple Vitamin (MULITIVITAMIN WITH MINERALS) TABS Take 1 tablet by mouth every morning.       . ranitidine (ZANTAC) 150 MG capsule Take 1 capsule (150 mg total) by mouth daily.  30 capsule  0  . risperiDONE (RISPERDAL) 0.25 MG tablet Take 0.25 mg by mouth at bedtime.      . Rivaroxaban (XARELTO) 20 MG TABS tablet Take 20 mg by mouth every morning.      . simvastatin (ZOCOR) 10 MG tablet Take 10 mg by mouth every morning.       . tamsulosin (FLOMAX) 0.4 MG CAPS capsule Take 1 capsule (0.4 mg total) by mouth daily.  30 capsule  0    Results for orders placed during the hospital encounter of 10/03/13 (from the past 48 hour(s))  URINALYSIS, ROUTINE W REFLEX MICROSCOPIC     Status: Abnormal   Collection Time    10/03/13  2:52 AM      Result Value Range   Color, Urine YELLOW  YELLOW   APPearance CLOUDY (*) CLEAR   Specific Gravity, Urine 1.008  1.005 - 1.030   pH 6.0  5.0 - 8.0   Glucose, UA NEGATIVE  NEGATIVE mg/dL   Hgb urine dipstick NEGATIVE  NEGATIVE   Bilirubin Urine NEGATIVE  NEGATIVE   Ketones, ur NEGATIVE  NEGATIVE mg/dL   Protein, ur NEGATIVE  NEGATIVE mg/dL   Urobilinogen, UA 1.0  0.0 - 1.0 mg/dL   Nitrite POSITIVE (*) NEGATIVE   Leukocytes, UA LARGE (*) NEGATIVE  URINE MICROSCOPIC-ADD ON     Status: Abnormal   Collection Time    10/03/13  2:52 AM      Result Value Range   Squamous Epithelial / LPF RARE  RARE   WBC, UA TOO NUMEROUS TO COUNT  <3 WBC/hpf   Comment: WITH CLUMPS   Bacteria, UA MANY (*) RARE  URINE CULTURE     Status: None   Collection Time    10/03/13  2:52 AM      Result Value Range   Specimen Description URINE, CLEAN CATCH     Special Requests NONE     Culture  Setup Time       Value: 10/03/2013 08:19     Performed at [place]       Value: >=100,000 COLONIES/ML     Performed at [facility]   Culture       Value: [place]      Performed at [facility]   Report Status PENDING    BASIC METABOLIC PANEL     Status: Abnormal   Collection Time    10/03/13  3:09 AM      Result Value Range   Sodium 140  137 - 147 mEq/L   Potassium 4.4  3.7 - 5.3 mEq/L   Chloride 100  96 - 112 mEq/L   CO2 26  19 - 32 mEq/L   Glucose, Bld 132 (*) 70 - 99 mg/dL   BUN 22  6 - 23 mg/dL   Creatinine, Ser 0.88  0.50 - 1.10 mg/dL   Calcium 9.5  8.4 - 10.5 mg/dL   GFR calc non Af Amer 56 (*) >90 mL/min   GFR calc Af Amer 65 (*) >90 mL/min   Comment: (NOTE)     The eGFR has been calculated using the CKD EPI equation.     This calculation has not been validated in all clinical situations.     eGFR's persistently <90 mL/min signify possible Chronic Kidney     Disease.  CBC WITH DIFFERENTIAL     Status: Abnormal   Collection Time    10/03/13  3:09 AM      Result Value Range   WBC 10.9 (*) 4.0 - 10.5 K/uL   RBC 4.24  3.87 - 5.11 MIL/uL   Hemoglobin 13.6  12.0 - 15.0 g/dL   HCT 40.4  36.0 - 46.0 %   MCV 95.3  78.0 - 100.0 fL   MCH 32.1  26.0 - 34.0 pg   MCHC 33.7  30.0 - 36.0 g/dL   RDW 14.4  11.5 - 15.5 %   Platelets 204  150 - 400 K/uL   Neutrophils Relative % 75  43 - 77 %   Neutro Abs 8.2 (*) 1.7 - 7.7 K/uL   Lymphocytes Relative 18  12 - 46 %   Lymphs Abs 2.0  0.7 - 4.0 K/uL   Monocytes Relative 5  3 - 12 %   Monocytes Absolute 0.6  0.1 - 1.0 K/uL   Eosinophils Relative 1  0 - 5 %   Eosinophils Absolute 0.1  0.0 - 0.7 K/uL   Basophils Relative 0  0 - 1 %  Basophils Absolute 0.0  0.0 - 0.1 K/uL  PROTIME-INR     Status: None   Collection Time    10/03/13  3:09 AM      Result Value Range   Prothrombin Time 14.2  11.6 - 15.2 seconds   INR 1.12  0.00 - 1.49  TYPE AND SCREEN     Status: None   Collection Time    10/03/13  3:09 AM      Result Value Range   ABO/RH(D) A POS     Antibody Screen NEG     Sample Expiration 10/06/2013    SURGICAL PCR SCREEN     Status: None   Collection Time    10/04/13  3:25 AM       Result Value Range   MRSA, PCR NEGATIVE  NEGATIVE   Staphylococcus aureus NEGATIVE  NEGATIVE   Comment:            The Xpert SA Assay (FDA     approved for NASAL specimens     in patients over 82 years of age),     is one component of     a comprehensive surveillance     program.  Test performance has     been validated by [employer] for patients greater     than or equal to 80 year old.     It is not intended     to diagnose infection nor to     guide or monitor treatment.  BASIC METABOLIC PANEL     Status: Abnormal   Collection Time    10/04/13  6:01 AM      Result Value Range   Sodium 136 (*) 137 - 147 mEq/L   Potassium 4.3  3.7 - 5.3 mEq/L   Chloride 100  96 - 112 mEq/L   CO2 26  19 - 32 mEq/L   Glucose, Bld 122 (*) 70 - 99 mg/dL   BUN 21  6 - 23 mg/dL   Creatinine, Ser 0.88  0.50 - 1.10 mg/dL   Calcium 8.6  8.4 - 10.5 mg/dL   GFR calc non Af Amer 56 (*) >90 mL/min   GFR calc Af Amer 65 (*) >90 mL/min   Comment: (NOTE)     The eGFR has been calculated using the CKD EPI equation.     This calculation has not been validated in all clinical situations.     eGFR's persistently <90 mL/min signify possible Chronic Kidney     Disease.  CBC     Status: Abnormal   Collection Time    10/04/13  6:01 AM      Result Value Range   WBC 7.6  4.0 - 10.5 K/uL   RBC 3.28 (*) 3.87 - 5.11 MIL/uL   Hemoglobin 10.5 (*) 12.0 - 15.0 g/dL   HCT 31.0 (*) 36.0 - 46.0 %   MCV 94.5  78.0 - 100.0 fL   MCH 32.0  26.0 - 34.0 pg   MCHC 33.9  30.0 - 36.0 g/dL   RDW 14.5  11.5 - 15.5 %   Platelets 167  150 - 400 K/uL   Dg Chest 1 View  10/03/2013   CLINICAL DATA:  Broken hip  EXAM: CHEST - 1 VIEW  COMPARISON:  08/19/2013  FINDINGS: Degraded by rotation. Prominent cardiac contour. Central vascular prominence. Interstitial prominence. Retrocardiac opacity not excluded. Aortic atherosclerosis. Hiatal hernia. Right lung granuloma. No definite pneumothorax or pleural effusion. Osteopenia and  multilevel degenerative changes. Left shoulder arthroplasty.  IMPRESSION: Enlarged cardiac contour with central vascular prominence, similar to prior. Hiatal hernia. Retrocardiac process not excluded.   Electronically Signed   By: Carlos Levering M.D.   On: 10/03/2013 04:40   Dg Hip Complete Right  10/03/2013   CLINICAL DATA:  Right hip pain status post fall  EXAM: RIGHT HIP - COMPLETE 2+ VIEW  COMPARISON:  08/20/2013  FINDINGS: Right femur intra medullary rod and proximal femur screw fixation of the intertrochanteric fracture with interval healing/ decreased conspicuity of the transfixed fracture. However, there is a new comminuted oblique fracture along the mid femoral shaft. There is lateral displacement of the dominant component. The femoral head remains seated within the acetabulum. Suboptimal AP pelvis view limits evaluation for rami or acetabular fractures though none is definitively identified. Surgical clips project over the mid pelvis.  IMPRESSION: Interval mid femoral shaft fracture. Recommend orthopedic consultation.   Electronically Signed   By: Carlos Levering M.D.   On: 10/03/2013 04:25   Ct Head Wo Contrast  10/03/2013   CLINICAL DATA:  Right hip pain status post unwitnessed fall  EXAM: CT HEAD WITHOUT CONTRAST  CT CERVICAL SPINE WITHOUT CONTRAST  TECHNIQUE: Multidetector CT imaging of the head and cervical spine was performed following the standard protocol without intravenous contrast. Multiplanar CT image reconstructions of the cervical spine were also generated.  COMPARISON:  Noncontrast CT scan of the brain dated October 17, 2011.  FINDINGS: CT HEAD FINDINGS  The ventricles are normal in size and position. There is very mild age-appropriate diffuse atrophy. There is decreased density in the deep white matter of both cerebral hemispheres consistent with chronic small vessel ischemic type change. There is no evidence of an acute intracranial hemorrhage. The cerebellum and brainstem are  normal in density.  At bone window settings the observed portions of the paranasal sinuses reveal small amounts of mucoperiosteal thickening and fluid in the right maxillary sinus and within the sphenoid sinuses with mucoperiosteal thickening in the ethmoid sinuses.  CT CERVICAL SPINE FINDINGS  The cervical vertebral bodies are preserved in height. There is an exaggerated cervical lordosis likely due to the patient's severe thoracic kyphosis. There is disc space narrowing at C4-5 and to a lesser extent at C5-6. The prevertebral soft tissue spaces appear normal. There is no evidence of a perched facet nor spinous process fracture. The odontoid is intact and the lateral masses of C1 align normally with those of C2. The bony ring at each cervical level is intact. No soft tissue hematoma of the visualized portions of the neck is demonstrated. The pulmonary apices are clear. On the lateral film partial compression of the body of T4 is visible.  IMPRESSION: 1. There are stable chronic changes within the brain. There is no evidence of an acute ischemic or hemorrhagic infarction. 2. There are findings within the paranasal sinuses which are not new but which likelyreflect both acute and chronic sinusitis. 3. There is no evidence of an acute cervical spine fracture nor dislocation. There are mild degenerative disc changes in the mid cervical spine. There is partial compression of the body of T4 which was visible on a and lateral chest x-ray dated Feb 01, 2013. Marland Kitchen   Electronically Signed   By: David  Martinique   On: 10/03/2013 08:05   Ct Cervical Spine Wo Contrast  10/03/2013   CLINICAL DATA:  Right hip pain status post unwitnessed fall  EXAM: CT HEAD WITHOUT CONTRAST  CT CERVICAL SPINE WITHOUT  CONTRAST  TECHNIQUE: Multidetector CT imaging of the head and cervical spine was performed following the standard protocol without intravenous contrast. Multiplanar CT image reconstructions of the cervical spine were also generated.   COMPARISON:  Noncontrast CT scan of the brain dated October 17, 2011.  FINDINGS: CT HEAD FINDINGS  The ventricles are normal in size and position. There is very mild age-appropriate diffuse atrophy. There is decreased density in the deep white matter of both cerebral hemispheres consistent with chronic small vessel ischemic type change. There is no evidence of an acute intracranial hemorrhage. The cerebellum and brainstem are normal in density.  At bone window settings the observed portions of the paranasal sinuses reveal small amounts of mucoperiosteal thickening and fluid in the right maxillary sinus and within the sphenoid sinuses with mucoperiosteal thickening in the ethmoid sinuses.  CT CERVICAL SPINE FINDINGS  The cervical vertebral bodies are preserved in height. There is an exaggerated cervical lordosis likely due to the patient's severe thoracic kyphosis. There is disc space narrowing at C4-5 and to a lesser extent at C5-6. The prevertebral soft tissue spaces appear normal. There is no evidence of a perched facet nor spinous process fracture. The odontoid is intact and the lateral masses of C1 align normally with those of C2. The bony ring at each cervical level is intact. No soft tissue hematoma of the visualized portions of the neck is demonstrated. The pulmonary apices are clear. On the lateral film partial compression of the body of T4 is visible.  IMPRESSION: 1. There are stable chronic changes within the brain. There is no evidence of an acute ischemic or hemorrhagic infarction. 2. There are findings within the paranasal sinuses which are not new but which likelyreflect both acute and chronic sinusitis. 3. There is no evidence of an acute cervical spine fracture nor dislocation. There are mild degenerative disc changes in the mid cervical spine. There is partial compression of the body of T4 which was visible on a and lateral chest x-ray dated Feb 01, 2013. Marland Kitchen   Electronically Signed   By: David   Martinique   On: 10/03/2013 08:05    ROS  Constitutional: Negative for diaphoresis.  HENT: Negative for hearing loss, ear pain, nosebleeds, congestion, sore throat, neck pain, tinnitus and ear discharge.  Eyes: Negative for blurred vision, double vision, photophobia, pain, discharge and redness.  Respiratory: Negative for cough, hemoptysis, sputum production, shortness of breath, wheezing and stridor.  Cardiovascular: Negative for chest pain, palpitations, orthopnea, claudication and leg swelling.  Gastrointestinal: Negative for nausea, vomiting and abdominal pain. Negative for heartburn, constipation, blood in stool and melena.  Genitourinary: Negative for dysuria, urgency, frequency, hematuria and flank pain.  Musculoskeletal: Negative for myalgias,.  Skin: Negative for itching and rash.  Neurological: Negative for tingling, tremors, sensory change, speech change, focal weakness, loss of consciousness and headaches.  Endo/Heme/Allergies: Negative for environmental allergies and polydipsia. Does not bruise/bleed easily.  Psychiatric/Behavioral: Negative for suicidal ideas. The patient is not nervous/anxious.   Blood pressure 116/92, pulse 82, temperature 97.8 F (36.6 C), temperature source Oral, resp. rate 17, height 5' 4.96" (1.65 m), weight 62.6 kg (138 lb 0.1 oz), SpO2 98.00%.  Physical Exam  Constitutional: Appears well developed and poorly nourished. No distress.  HENT: Normocephalic. External right and left ear normal. Oropharynx is clear and dry.  Eyes: Conjunctivae-pale pink. EOM are normal. PERRLA, no scleral icterus.  Neck: Neck supple. No JVD. No tracheal deviation. No thyromegaly.  CVS: Irregular rate and rhythm. S1/S2 +,  no murmurs, no gallops, no carotid bruit.  Pulmonary: Effort and breath sounds normal, no stridor, rhonchi, wheezes, rales.  Abdominal: Soft. BS +, no distension, tenderness, rebound or guarding.  Musculoskeletal: Tenderness around right hip and knee area with  surgical scar.  Lymphadenopathy: No lymphadenopathy noted, cervical, inguinal. Neuro: Alert. No cranial nerve deficit.  Skin: Skin is warm and dry. No rash noted. Not diaphoretic. No erythema.   Psychiatric: Normal mood and affect.    Assessment/Plan Chronic atrial fibrillation with controlled ventricular response RBBB Possible old ASWMI Possible NSTEMI S/P Right hip and lower end of femur fracture Hypothyroidism  Agree with r/o MI. IV heparin without bolus. Echocardiogram. Medical treatment as much as possible due to advance age in NH resident with limited activity and DNR.  Sheila Ocasio S 10/04/2013, 4:47 PM

## 2013-10-04 NOTE — Progress Notes (Signed)
Spoke with dr Sharin Monssantami per dr Randa Evensedwards request regarding pts heart rhythm during surgery consisting of pvc run and a flutter rhythm.he will come by to assess pt

## 2013-10-04 NOTE — Transfer of Care (Signed)
Immediate Anesthesia Transfer of Care Note  Patient: Yvonne HawkHelen Mcleroy  Procedure(s) Performed: Procedure(s): OPEN REDUCTION INTERNAL FIXATION (ORIF) DISTAL FEMUR FRACTURE (Right)  Patient Location: PACU  Anesthesia Type:General  Level of Consciousness: awake, alert  and patient cooperative  Airway & Oxygen Therapy: Patient Spontanous Breathing and Patient connected to nasal cannula oxygen  Post-op Assessment: Report given to PACU RN and frequent runs of PVCs noted. BP stable. Notified Dr. Randa EvensEdwards.   Post vital signs: Reviewed and stable  Complications: No apparent anesthesia complications

## 2013-10-04 NOTE — Progress Notes (Signed)
CRITICAL VALUE ALERT  Critical value received:  Troponin 0.38  Date of notification:  10/04/12  Time of notification:  1645  Critical value read back:yes  Nurse who received alert: Arman Bogusorothy Orvil Faraone RN  MD notified (1st page): Mahala MenghiniSamtani MD

## 2013-10-04 NOTE — Progress Notes (Addendum)
ANTICOAGULATION CONSULT NOTE - Initial Consult  Pharmacy Consult for heparin Indication: chest pain/ACS  No Known Allergies  Patient Measurements: Height: 5' 4.96" (165 cm) (From 08/19/13) Weight: 138 lb 0.1 oz (62.6 kg) IBW/kg (Calculated) : 56.91   Vital Signs: Temp: 97.8 F (36.6 C) (01/27 1400) Temp src: Oral (01/27 1110) BP: 116/92 mmHg (01/27 1530) Pulse Rate: 82 (01/27 1530)  Labs:  Recent Labs  10/03/13 0309 10/04/13 0601  HGB 13.6 10.5*  HCT 40.4 31.0*  PLT 204 167  LABPROT 14.2  --   INR 1.12  --   CREATININE 0.88 0.88    Estimated Creatinine Clearance: 37.4 ml/min (by C-G formula based on Cr of 0.88).   Medical History: Past Medical History  Diagnosis Date  . Hypertension   . Constipation   . Pain   . Acid reflux   . Hypercholesteremia   . Hypothyroid   . CHF (congestive heart failure)   . Bronchitis   . Hyperlipidemia   . Anemia     Medications:  Prescriptions prior to admission  Medication Sig Dispense Refill  . aspirin EC 325 MG tablet Take 1 tablet (325 mg total) by mouth daily.  30 tablet  0  . citalopram (CELEXA) 10 MG tablet Take 1 tablet (10 mg total) by mouth daily.  30 tablet  0  . docusate sodium (COLACE) 100 MG capsule Take 100 mg by mouth 2 (two) times daily.        . hydrALAZINE (APRESOLINE) 25 MG tablet Take 25 mg by mouth 3 (three) times daily.      Marland Kitchen HYDROcodone-acetaminophen (NORCO/VICODIN) 5-325 MG per tablet Take 1-2 tablets by mouth every 6 (six) hours as needed for moderate pain.  30 tablet  0  . lamoTRIgine (LAMICTAL) 25 MG tablet Take 25 mg by mouth 2 (two) times daily.      Marland Kitchen levothyroxine (SYNTHROID, LEVOTHROID) 88 MCG tablet Take 88 mcg by mouth daily before breakfast.      . lisinopril (PRINIVIL,ZESTRIL) 5 MG tablet Take 5 mg by mouth every morning.       . Multiple Vitamin (MULITIVITAMIN WITH MINERALS) TABS Take 1 tablet by mouth every morning.       . ranitidine (ZANTAC) 150 MG capsule Take 1 capsule (150 mg  total) by mouth daily.  30 capsule  0  . risperiDONE (RISPERDAL) 0.25 MG tablet Take 0.25 mg by mouth at bedtime.      . Rivaroxaban (XARELTO) 20 MG TABS tablet Take 20 mg by mouth every morning.      . simvastatin (ZOCOR) 10 MG tablet Take 10 mg by mouth every morning.       . tamsulosin (FLOMAX) 0.4 MG CAPS capsule Take 1 capsule (0.4 mg total) by mouth daily.  30 capsule  0   Scheduled:  . [facility] HOLD] aspirin EC  325 mg Oral Daily  . Chlorhexidine Gluconate Cloth  6 each Topical Once   And  . Chlorhexidine Gluconate Cloth  6 each Topical Once  . [facility] HOLD] citalopram  10 mg Oral Daily  . [facility] HOLD] docusate sodium  100 mg Oral BID  . [facility] HOLD] famotidine  20 mg Oral Daily  . heparin  750 Units/hr Intravenous To PACU  . [facility] HOLD] hydrALAZINE  25 mg Oral TID  . [facility] HOLD] lamoTRIgine  25 mg Oral BID  . [facility] HOLD] levothyroxine  88 mcg Oral QAC breakfast  . [[facility] HOLD] lisinopril  5 mg Oral q morning - 10a  . [  [facility] HOLD] multivitamin with minerals  1 tablet Oral q morning - 10a  . [[facility] HOLD] risperiDONE  0.25 mg Oral QHS  . [facility] HOLD] simvastatin  10 mg Oral Daily  . [facility] HOLD] tamsulosin  0.4 mg Oral Daily    Assessment: 80 yo female to start heparin for suspected NSTEMI. Patient is noted on rivaroxaban PTA (20mg /day; last dose 10/02/13) and [facility] reported this was for DVT prophylaxis post R hip surgery.  PT/INR at admission was 14.2/1.12 which are lower than expected on Xarelto. Patient noted s/p OR today for left femur fracure  Goal of Therapy:  Heparin level= 0.3-0.5 Monitor platelets by anticoagulation protocol: Yes   Plan:  -Begin heparin at 750 units/hr -Heparin level and aPTT in 8 hours -Daily CBC and heparin level  Harland GermanAndrew Angeligue Bowne, Pharm D 10/04/2013 4:44 PM

## 2013-10-04 NOTE — Anesthesia Postprocedure Evaluation (Signed)
  Anesthesia Post-op Note  Patient: Yvonne HawkHelen Donovan  Procedure(s) Performed: Procedure(s): OPEN REDUCTION INTERNAL FIXATION (ORIF) DISTAL FEMUR FRACTURE (Right)  Patient Location: PACU  Anesthesia Type:General  Level of Consciousness: awake  Airway and Oxygen Therapy: Patient Spontanous Breathing  Post-op Pain: mild  Post-op Assessment: Post-op Vital signs reviewed  Post-op Vital Signs: Reviewed  Complications: No apparent anesthesia complications

## 2013-10-04 NOTE — Progress Notes (Signed)
Report given to elise rn as caregiver 

## 2013-10-04 NOTE — Preoperative (Signed)
Beta Blockers   Reason not to administer Beta Blockers:Not Applicable 

## 2013-10-04 NOTE — Op Note (Signed)
10/03/2013 - 10/04/2013  12:53 PM  PATIENT:  Yvonne Donovan    PRE-OPERATIVE DIAGNOSIS:  femur fracture  POST-OPERATIVE DIAGNOSIS:  Same  PROCEDURE:  OPEN REDUCTION INTERNAL FIXATION (ORIF) DISTAL FEMUR FRACTURE  SURGEON:  Salia Cangemi, D, MD  ASSISTANT: None  ANESTHESIA:   gen  PREOPERATIVE INDICATIONS:  Yvonne HawkHelen Roehrig is a  80 y.o. female with a diagnosis of femur fracture who failed conservative measures and elected for surgical management.    The risks benefits and alternatives were discussed with the patient preoperatively including but not limited to the risks of infection, bleeding, nerve injury, cardiopulmonary complications, the need for revision surgery, among others, and the patient was willing to proceed.  OPERATIVE IMPLANTS: distal interlock screws  BLOOD LOSS: min  COMPLICATIONS: none  TOURNIQUET TIME: none  OPERATIVE PROCEDURE:  Patient was identified in the preoperative holding area and site was marked by me She was transported to the operating theater and placed on the table in supine position taking care to pad all bony prominences. After a preincinduction time out anesthesia was induced. The right lower extremity was prepped and draped in normal sterile fashion and a pre-incision timeout was performed. She received ancef for preoperative antibiotics.   I first examined her right lower sham E. under fluoroscopic guidance for flat fracture length and stability she had good leg stability given the spiral nature of the fracture there was some rotational instability as I did elect to place 2 distal interlock screws and the previously placed gamma nail there is no instability distal to the gamma nail. I placed her toes pointed straight up in line with her noninjured leg at assess the rotation I then used a perfect circles technique to place 2 static distal interlock screws. These went in without complication the wounds were then thoroughly irrigated and closed using a  4-0 Monocryl with a sterile dressing. She was awoken and taken the PACU in stable condition.  POST OPERATIVE PLAN: Weightbearing as tolerated aspirin for DVT prophylaxis.

## 2013-10-04 NOTE — Significant Event (Addendum)
Called by RN as patient in A flutter/fib Rate controlled 82. Not apparently new-EKG 08/19/13 confirms this  EKG shows reciprocal changes in V4-V6-suggestive NSTEMI Long discussion with Son Ron-agreeable to work-up  Unclear currently as to Code status--D-i-l states she thinks her m-i-l is DNR  Consulted Cardiology Dr. Algie CofferKadakia who recommends low dose beta blockade metoprolol 12.5 bid if BP can support Informed Dr. Arlys JohnMurphy Ortho ,who states he has no issue with Anti-coagulation as his incision was minimal Will start IV Heparin per pharmacy for NSTEMI Await troponin/TSH Tx to SDU-2h  >40 min  Pleas KochJai Ho Parisi, MD [facility] [phone]

## 2013-10-04 NOTE — Progress Notes (Signed)
Dr Sharin Monssantami here at bedside

## 2013-10-04 NOTE — Progress Notes (Addendum)
Note: This document was prepared with digital dictation and possible smart phrase technology. Any transcriptional errors that result from this process are unintentional.   Yvonne Donovan WUX:[id number] DOB: 06/06/1920 DOA: 10/03/2013 PCP: No primary provider on file.  Brief narrative: 80 y/o ?, known mod-severe dementia [stays at [facility] since 2013] known h/o hypothyroidism,htn, echo EF 55-60 % [without chf], hld and h/o recent two part intertrochanteric fracture, right Hip. S/p ORIF intramedullary gamma nail 10 mm x 380 mm 08/19/13, prior aspiration PNA 02/01/13 + Transaminitis + A/CKD3, admission [facility] for delusional disorder 07/20/11 admitted to [facility] from [facility] with re-fracture of R hip. Scheduled for surgery 10/04/13   Consultants:  Orthopedics  Procedures:  xray  Antibiotics:  None cutrently   Subjective  Alert.  Demented to the degree she doesn;t know where she is D-i-l in room Emphatically state "I do not want surgery"   Objective    Interim History: none  Telemetry: nsr   Objective: Filed Vitals:   10/03/13 1200 10/03/13 1521 10/03/13 2104 10/04/13 0430  BP: 116/55 103/52 114/88 121/62  Pulse: 93 76 82 74  Temp: 98.3 F (36.8 C)  98.5 F (36.9 C) 98.6 F (37 C)  TempSrc: Oral  Oral Oral  Resp: 16  18 18   Height:  5' 4.96" (1.65 m)    Weight:  61.7 kg (136 lb 0.4 oz)  62.6 kg (138 lb 0.1 oz)  SpO2: 94%  96% 94%    Intake/Output Summary (Last 24 hours) at 10/04/13 0734 Last data filed at 10/04/13 0604  Gross per 24 hour  Intake   1440 ml  Output    300 ml  Net   1140 ml    Exam:  General: EOMI, frail, no jvd Cardiovascular: S1-S2 no murmur rub or gallop Respiratory: Clinically clear Abdomen: Soft nontender nondistended, abdominal hernia umbilical noted Skin no lower extremity edema, hernia as above Neuro confused  Data Reviewed: Basic Metabolic Panel:  Recent Labs Lab 10/03/13 0309 10/04/13 0601  NA 140 136*  K 4.4 4.3  CL 100 100  CO2  26 26  GLUCOSE 132* 122*  BUN 22 21  CREATININE 0.88 0.88  CALCIUM 9.5 8.6   Liver Function Tests: No results found for this basename: AST, ALT, ALKPHOS, BILITOT, PROT, ALBUMIN,  in the last 168 hours No results found for this basename: LIPASE, AMYLASE,  in the last 168 hours No results found for this basename: AMMONIA,  in the last 168 hours CBC:  Recent Labs Lab 10/03/13 0309 10/04/13 0601  WBC 10.9* 7.6  NEUTROABS 8.2*  --   HGB 13.6 10.5*  HCT 40.4 31.0*  MCV 95.3 94.5  PLT 204 167   Cardiac Enzymes: No results found for this basename: CKTOTAL, CKMB, CKMBINDEX, TROPONINI,  in the last 168 hours BNP: No components found with this basename: POCBNP,  CBG: No results found for this basename: GLUCAP,  in the last 168 hours  Recent Results (from the past 240 hour(s))  SURGICAL PCR SCREEN     Status: None   Collection Time    10/04/13  3:25 AM      Result Value Range Status   MRSA, PCR NEGATIVE  NEGATIVE Final   Staphylococcus aureus NEGATIVE  NEGATIVE Final   Comment:            The Xpert SA Assay (FDA     approved for NASAL specimens     in patients over 36 years of age),     is one  component of     a comprehensive surveillance     program.  Test performance has     been validated by [facility] for patients greater     than or equal to 78 year old.     It is not intended     to diagnose infection nor to     guide or monitor treatment.     Studies:              All Imaging reviewed and is as per above notation   Scheduled Meds: . aspirin EC  325 mg Oral Daily  . Chlorhexidine Gluconate Cloth  6 each Topical Once   And  . Chlorhexidine Gluconate Cloth  6 each Topical Once  . citalopram  10 mg Oral Daily  . docusate sodium  100 mg Oral BID  . famotidine  20 mg Oral Daily  . hydrALAZINE  25 mg Oral TID  . lamoTRIgine  25 mg Oral BID  . levothyroxine  88 mcg Oral QAC breakfast  . lisinopril  5 mg Oral q morning - 10a  . multivitamin with minerals  1  tablet Oral q morning - 10a  . risperiDONE  0.25 mg Oral QHS  . simvastatin  10 mg Oral Daily  . tamsulosin  0.4 mg Oral Daily   Continuous Infusions: . sodium chloride 10 mL/hr at 10/03/13 1049  . dextrose 5 % and 0.45% NaCl 100 mL/hr (10/03/13 1049)     Assessment/Plan: 1. Acute right hip fracture-per orthopedics. Please minimize pain medications postoperatively given advanced dementia-weight bearing status as well as anticoagulation (? Aspirin 325) to be commented upon, thank you 2. Hypothyroidism continue Synthroid 88 mcg every morning-TSH in 3-6 weeks 3. Hypertension continue hydralazine 25 mg 3 times a day, lisinopril 5 mg every morning 4. EF50-60%, NOT CHF 5. Moderate to advanced dementia with behavioral disturbances-continue risperidone 0 point 25 each bedtime, citalopram 10 daily, Lamictal 25 twice a day-need outpatient geropsychiatry input 6. Hyperlipidemia-continue statin perioperatively 10 mg.-Some evidence to suggest protective against perioperative complications 7. GERD continue famotidine 20 daily 8. Urinary incontinence-continue tamsulosin 0.4 daily-carefully monitor blood pressures  Code Status: Full code  Family Communication: Discussed with daughter alive bedside  Disposition Plan: Pending surgery later today 1/27   Pleas KochJai Romelia Bromell, MD  [facility] Pager [phone] 10/04/2013, 7:34 AM    LOS: 1 day

## 2013-10-04 NOTE — Clinical Social Work Psychosocial (Signed)
     Clinical Social Work Department BRIEF PSYCHOSOCIAL ASSESSMENT 10/04/2013  Patient:  Yvonne Donovan,Yvonne     Account Number:  [id number]     Admit date:  10/03/2013  Clinical Social Worker:  Tiburcio PeaROWDER,Yvonne Donovan, LCSWA  Date/Time:  10/04/2013 05:00 PM  Referred by:  Physician  Date Referred:  10/04/2013 Referred for  SNF Placement   Other Referral:   Return to SNF   Interview type:  Family Other interview type:   Daughter-in-law- Yvonne Donovan    PSYCHOSOCIAL DATA Living Status:  FACILITY [facility] Level of care:  Skilled Nursing Facility Primary support name:  Yvonne Donovan  (c) [phone] Primary support relationship to patient:  FAMILY Degree of support available:   Daughter-in-law  Son:  Yvonne Donovan  (c) [phone]    Family is very supportive    CURRENT CONCERNS Current Concerns  Post-Acute Placement   Other Concerns:    SOCIAL WORK ASSESSMENT / PLAN 80 year old female- resident of [facility] was admitted yesterday due to a fractured hip.  She is currently in surgery for hip repair. CSW was notfied by nursing that family was requesting to speak to a Child psychotherapistsocial worker. CSW spoke with patient's daughter-in-law Yvonne Donovan who stated the and patient's son are the primary support for patient. She wanted to talk about [facility] and plans to call facility to arrange this.  Fl2 placed on chart for MD's signature. [employer] Medicare auth request has been intiated.   Assessment/plan status:  Psychosocial Support/Ongoing Assessment of Needs Other assessment/ plan:   Information/referral to community resources:   None at this time.    PATIENTS/FAMILYS RESPONSE TO PLAN OF CARE: Patient is currently in surgery so CSW was unable to talk to her. Patient's family is supportive and want patient to return to [facility] when medically ready and will pay to hold her bed at the facility. CSW will follow  up tomorrow with patient and family.  Yvonne Donovan, LCSWA [phone]

## 2013-10-04 NOTE — Discharge Instructions (Signed)
Bear weight as tolerated  Take an aspirin 325 daily for 30 days post op

## 2013-10-05 DIAGNOSIS — I4892 Unspecified atrial flutter: Secondary | ICD-10-CM

## 2013-10-05 DIAGNOSIS — E039 Hypothyroidism, unspecified: Secondary | ICD-10-CM

## 2013-10-05 DIAGNOSIS — N390 Urinary tract infection, site not specified: Secondary | ICD-10-CM

## 2013-10-05 LAB — BASIC METABOLIC PANEL
BUN: 14 mg/dL (ref 6–23)
CO2: 25 mEq/L (ref 19–32)
Calcium: 8.7 mg/dL (ref 8.4–10.5)
Chloride: 103 mEq/L (ref 96–112)
Creatinine, Ser: 0.84 mg/dL (ref 0.50–1.10)
GFR calc Af Amer: 68 mL/min — ABNORMAL LOW (ref >90)
GFR, EST NON AFRICAN AMERICAN: 59 mL/min — AB (ref >90)
GLUCOSE: 91 mg/dL (ref 70–99)
POTASSIUM: 4.6 meq/L (ref 3.7–5.3)
Sodium: 139 mEq/L (ref 137–147)

## 2013-10-05 LAB — CBC
HEMATOCRIT: 29 % — AB (ref 36.0–46.0)
Hemoglobin: 9.7 g/dL — ABNORMAL LOW (ref 12.0–15.0)
MCH: 31.6 pg (ref 26.0–34.0)
MCHC: 33.4 g/dL (ref 30.0–36.0)
MCV: 94.5 fL (ref 78.0–100.0)
Platelets: 148 10*3/uL — ABNORMAL LOW (ref 150–400)
RBC: 3.07 MIL/uL — ABNORMAL LOW (ref 3.87–5.11)
RDW: 14.3 % (ref 11.5–15.5)
WBC: 6.1 10*3/uL (ref 4.0–10.5)

## 2013-10-05 LAB — HEPARIN LEVEL (UNFRACTIONATED): Heparin Unfractionated: 0.16 IU/mL — ABNORMAL LOW (ref 0.30–0.70)

## 2013-10-05 LAB — APTT: aPTT: 58 seconds — ABNORMAL HIGH (ref 24–37)

## 2013-10-05 LAB — TROPONIN I: Troponin I: <0.3 ng/mL (ref <0.30)

## 2013-10-05 MED ORDER — RIVAROXABAN 15 MG PO TABS
15.0000 mg | ORAL_TABLET | Freq: Every day | ORAL | Status: DC
  Administered 2013-10-05: 15 mg via ORAL
  Filled 2013-10-05: qty 1

## 2013-10-05 MED ORDER — RIVAROXABAN 20 MG PO TABS
20.0000 mg | ORAL_TABLET | Freq: Every morning | ORAL | Status: DC
Start: 2013-10-05 — End: 2013-10-05
  Filled 2013-10-05: qty 1

## 2013-10-05 MED ORDER — RIVAROXABAN 15 MG PO TABS
15.0000 mg | ORAL_TABLET | Freq: Every morning | ORAL | Status: DC
  Filled 2013-10-05: qty 1

## 2013-10-05 MED ORDER — SULFAMETHOXAZOLE-TRIMETHOPRIM 400-80 MG PO TABS: 1.0000 | ORAL_TABLET | Freq: Two times a day (BID) | ORAL | Status: AC

## 2013-10-05 MED ORDER — CEFAZOLIN SODIUM 1-5 GM-% IV SOLN
1.0000 g | Freq: Two times a day (BID) | INTRAVENOUS | Status: DC
  Administered 2013-10-05: 1 g via INTRAVENOUS
  Filled 2013-10-05 (×3): qty 50

## 2013-10-05 MED ORDER — HYDROCODONE-ACETAMINOPHEN 5-325 MG PO TABS: 1.0000 | ORAL_TABLET | Freq: Four times a day (QID) | ORAL | Status: DC | PRN

## 2013-10-05 NOTE — Evaluation (Signed)
Physical Therapy Evaluation Patient Details Name: Yvonne Donovan MRN: [id number] DOB: 07-25-1920 Today's Date: 10/05/2013 Time: [id number] PT Time Calculation (min): 20 min  PT Assessment / Plan / Recommendation History of Present Illness  Pt admitted with rightdistal femur fx. with ORIF.    Clinical Impression  Pt admitted with above. Pt currently with functional limitations due to the deficits listed below (see PT Problem List).  Pt will benefit from skilled PT to increase their independence and safety with mobility to allow discharge to the venue listed below. Needs to return to skilled NH for therapy and 24 hour care.      PT Assessment  Patient needs continued PT services    Follow Up Recommendations  SNF;Supervision/Assistance - 24 hour        Barriers to Discharge Decreased caregiver support      Equipment Recommendations  Other (comment) (TBA)         Frequency Min 3X/week    Precautions / Restrictions Precautions Precautions: Fall Restrictions Weight Bearing Restrictions: Yes RLE Weight Bearing: Weight bearing as tolerated   Pertinent Vitals/Pain  HR 80 bpm, BP initially 89/43 with pt asymptomatic.  122/60 was BP on departure.       Mobility  Bed Mobility Overal bed mobility: Needs Assistance;+2 for physical assistance Bed Mobility: Supine to Sit Supine to sit: Max assist;+2 for physical assistance General bed mobility comments: Needed assist for LEs and elevation of trunk Transfers Overall transfer level: Needs assistance Equipment used: 2 person hand held assist Transfers: Stand Pivot Transfers Stand pivot transfers: +2 physical assistance;Mod assist General transfer comment: Pt needed assist to lean forward.  Needed assist to stand from 2 persons.  Needed assist to weight shift but not to take steps as she was actually able to step around to chair.     Exercises General Exercises - Lower Extremity Ankle Circles/Pumps: AAROM;Both;5 reps;Supine Quad Sets:  AAROM;Both;5 reps;Seated   PT Diagnosis: Acute pain;Generalized weakness  PT Problem List: Decreased balance;Decreased activity tolerance;Decreased mobility;Decreased knowledge of use of DME;Decreased safety awareness;Decreased knowledge of precautions;Pain PT Treatment Interventions: DME instruction;Gait training;Functional mobility training;Therapeutic activities;Therapeutic exercise;Balance training;Patient/family education     PT Goals(Current goals can be found in the care plan section) Acute Rehab PT Goals Patient Stated Goal: confused - unable to state PT Goal Formulation: With patient Time For Goal Achievement: 10/19/13 Potential to Achieve Goals: Fair  Visit Information  Last PT Received On: 10/05/13 Assistance Needed: +2 History of Present Illness: Pt admitted with rightdistal femur fx. with ORIF.         Prior Functioning  Home Living Family/patient expects to be discharged to:: Skilled nursing facility Prior Function Level of Independence: Needs assistance Gait / Transfers Assistance Needed: Unsure of exact assist but did need assist.   ADL's / Homemaking Assistance Needed: Assist for ADLS Communication Communication: No difficulties    Cognition  Cognition Arousal/Alertness: Awake/alert Behavior During Therapy: WFL for tasks assessed/performed Overall Cognitive Status: History of cognitive impairments - at baseline Memory: Decreased short-term memory    Extremity/Trunk Assessment Upper Extremity Assessment Upper Extremity Assessment: Defer to OT evaluation Lower Extremity Assessment Lower Extremity Assessment: RLE deficits/detail RLE: Unable to fully assess due to pain Cervical / Trunk Assessment Cervical / Trunk Assessment: Kyphotic   Balance Balance Overall balance assessment: Needs assistance;History of Falls Sitting-balance support: Bilateral upper extremity supported;Feet supported Sitting balance-Leahy Scale: Poor Sitting balance - Comments: Sat EOB  5 min with mod assist at times for balance.   Postural control: Posterior lean  End of Session PT - End of Session Equipment Utilized During Treatment: Gait belt;Oxygen Activity Tolerance: Patient limited by fatigue Patient left: in chair;with call bell/phone within reach Nurse Communication: Mobility status;Need for lift equipment       INGOLD,Gracious Renken 10/05/2013, 12:42 PM [facility] [phone] (pager)

## 2013-10-05 NOTE — Progress Notes (Addendum)
[facility] and can go back today. Pt's previous CS[facility], but they will not authorize placement back at SNF until they receive PT/OT formal recommendation for this level of care. PT/OT ordered yesterday (01/27) around 5:30pm. Pt will not be able to discharge back to SNF until PT/OT have evaluated her and given SNF recommendation.  Addendum: [facility]. CSW received call from [facility]. CSW left voicemail for admissions with [facility] explaining CSW sent PT note and [facility] but if facility already heard from them, CSW can discharge pt back today. RN to call MD and request discharge summary.  Maryclare LabradorJulie Gayatri Teasdale, MSW, [facility] Clinical Social Worker [phone]

## 2013-10-05 NOTE — Discharge Summary (Addendum)
Physician Discharge Summary  Yvonne Donovan ZOX:[id number] DOB: 01/17/1920 DOA: 10/03/2013  PCP: No primary provider on file.  Admit date: 10/03/2013 Discharge date: 10/05/2013  Time spent: 50* minutes  Recommendations for Outpatient Follow-up:  1. Follow up PCP in 2 weeks  Discharge Diagnoses:  Active Problems:   Anxiety   Elevated LFTs   Hypothyroidism   Hypercholesterolemia   Hip fracture, right   Acute renal failure   Hip fracture   Atrial flutter   Discharge Condition: *Stable  Diet recommendation: Low salt diet  Filed Weights   10/03/13 1521 10/04/13 0430 10/05/13 0400  Weight: 61.7 kg (136 lb 0.4 oz) 62.6 kg (138 lb 0.1 oz) 48.5 kg (106 lb 14.8 oz)    History of present illness:  presents from SNF after slipping and undergoing a fall. She had undergone ORIF of R hip on 08/20/13. It appears she has refractured the bone around that hardware. Ortho was consulted who wishes [facility] and they will see patient in the morning. [facility] has been consulted to arrange this admission. She is having R hip pain. No other complaints    Hospital Course:  1. Acute right hip fracture-per orthopedics. S/p ORIF , patient to be discharged to SNF today. 2. Hypothyroidism continue Synthroid 88 mcg every morning-TSH in 3-6 weeks 3. Hypertension continue hydralazine 25 mg 3 times a day, lisinopril 5 mg every morning 4. EF50-60%, NOT CHF 5. Moderate to advanced dementia with behavioral disturbances-continue risperidone 0 point 25 each bedtime, citalopram 10 daily, Lamictal 25 twice a day-need outpatient geropsychiatry input 6. Hyperlipidemia-continue statin perioperatively 10 mg.-Some evidence to suggest protective against perioperative complications 7. GERD continue famotidine 20 daily 8. Urinary incontinence-continue tamsulosin 0.4 daily-carefully monitor blood pressures 9. UTI- Will start the patient on bactrim DS 1 tab po BID x 7 days. Urine culture grew E coli sensitive  to bactrim. 10. Atrial fibrillation- Patient went into A fib and was seen by cardiology, no new interventions. 11. Possible NSTEMI- Medical management per cardiology. Patient's troponin went up to 0.38 and then came down to normal.  Procedures:  *ORIF  Consultations:  Orthopedics  Cardiology  Discharge Exam: Filed Vitals:   10/05/13 1200  BP:   Pulse:   Temp:   Resp: 22    General: Appear in no acute distress Cardiovascular: *s1s2 RRR Respiratory: Clear bilaterally  Discharge Instructions  Discharge Orders   Future Orders Complete By Expires   Diet - low sodium heart healthy  As directed    Increase activity slowly  As directed    Weight bearing as tolerated  As directed    Questions:     Laterality:     Extremity:         Medication List         aspirin EC 325 MG tablet  Take 1 tablet (325 mg total) by mouth daily.     citalopram 10 MG tablet  Commonly known as:  CELEXA  Take 1 tablet (10 mg total) by mouth daily.     docusate sodium 100 MG capsule  Commonly known as:  COLACE  Take 100 mg by mouth 2 (two) times daily.     hydrALAZINE 25 MG tablet  Commonly known as:  APRESOLINE  Take 25 mg by mouth 3 (three) times daily.     HYDROcodone-acetaminophen 5-325 MG per tablet  Commonly known as:  NORCO/VICODIN  Take 1-2 tablets by mouth every 6 (six) hours as needed for moderate pain.  HYDROcodone-acetaminophen 5-325 MG per tablet  Commonly known as:  NORCO  Take 2 tablets by mouth every 4 (four) hours as needed.     lamoTRIgine 25 MG tablet  Commonly known as:  LAMICTAL  Take 25 mg by mouth 2 (two) times daily.     levothyroxine 88 MCG tablet  Commonly known as:  SYNTHROID, LEVOTHROID  Take 88 mcg by mouth daily before breakfast.     lisinopril 5 MG tablet  Commonly known as:  PRINIVIL,ZESTRIL  Take 5 mg by mouth every morning.     multivitamin with minerals Tabs tablet  Take 1 tablet by mouth every morning.     ranitidine 150 MG capsule   Commonly known as:  ZANTAC  Take 1 capsule (150 mg total) by mouth daily.     risperiDONE 0.25 MG tablet  Commonly known as:  RISPERDAL  Take 0.25 mg by mouth at bedtime.     simvastatin 10 MG tablet  Commonly known as:  ZOCOR  Take 10 mg by mouth every morning.     sulfamethoxazole-trimethoprim 400-80 MG per tablet  Commonly known as:  SEPTRA  Take 1 tablet by mouth 2 (two) times daily.     tamsulosin 0.4 MG Caps capsule  Commonly known as:  FLOMAX  Take 1 capsule (0.4 mg total) by mouth daily.     XARELTO 20 MG Tabs tablet  Generic drug:  Rivaroxaban  Take 20 mg by mouth every morning.       No Known Allergies     Follow-up Information   Follow up with Margarita Rana, D, MD. Schedule an appointment as soon as possible for a visit on 10/19/2013.   Specialty:  Orthopedic Surgery   Contact information:   [address] [place] [address] [phone]       Follow up with [facility] S, MD. Schedule an appointment as soon as possible for a visit in 2 months.   Specialty:  Cardiology   Contact information:   [address] [place] [address] [phone]        The results of significant diagnostics from this hospitalization (including imaging, microbiology, ancillary and laboratory) are listed below for reference.    Significant Diagnostic Studies: Dg Chest 1 View  10/03/2013   CLINICAL DATA:  Broken hip  EXAM: CHEST - 1 VIEW  COMPARISON:  08/19/2013  FINDINGS: Degraded by rotation. Prominent cardiac contour. Central vascular prominence. Interstitial prominence. Retrocardiac opacity not excluded. Aortic atherosclerosis. Hiatal hernia. Right lung granuloma. No definite pneumothorax or pleural effusion. Osteopenia and multilevel degenerative changes. Left shoulder arthroplasty.  IMPRESSION: Enlarged cardiac contour with central vascular prominence, similar to prior. Hiatal hernia. Retrocardiac process not excluded.   Electronically Signed    By: Jearld Lesch M.D.   On: 10/03/2013 04:40   Dg Hip Complete Right  10/03/2013   CLINICAL DATA:  Right hip pain status post fall  EXAM: RIGHT HIP - COMPLETE 2+ VIEW  COMPARISON:  08/20/2013  FINDINGS: Right femur intra medullary rod and proximal femur screw fixation of the intertrochanteric fracture with interval healing/ decreased conspicuity of the transfixed fracture. However, there is a new comminuted oblique fracture along the mid femoral shaft. There is lateral displacement of the dominant component. The femoral head remains seated within the acetabulum. Suboptimal AP pelvis view limits evaluation for rami or acetabular fractures though none is definitively identified. Surgical clips project over the mid pelvis.  IMPRESSION: Interval mid femoral shaft fracture. Recommend orthopedic consultation.  Electronically Signed   By: Jearld Lesch M.D.   On: 10/03/2013 04:25   Dg Femur Right  10/04/2013   CLINICAL DATA:  Right femur fracture  EXAM: DG C-ARM 1-60 MIN; RIGHT FEMUR - 2 VIEW  COMPARISON:  Preoperative radiographs 10/03/2013  FINDINGS: Revision ORIF of right periprosthetic femoral fracture. Two distal interlocking screws are now present within the intra medullary nail. Alignment of fracture fragments appears improved. The proximal aspect of the intra medullary nail is not included in the field of view.  IMPRESSION: Revision ORIF of right periprosthetic femoral fracture as above.   Electronically Signed   By: Malachy Moan M.D.   On: 10/04/2013 17:11   Ct Head Wo Contrast  10/03/2013   CLINICAL DATA:  Right hip pain status post unwitnessed fall  EXAM: CT HEAD WITHOUT CONTRAST  CT CERVICAL SPINE WITHOUT CONTRAST  TECHNIQUE: Multidetector CT imaging of the head and cervical spine was performed following the standard protocol without intravenous contrast. Multiplanar CT image reconstructions of the cervical spine were also generated.  COMPARISON:  Noncontrast CT scan of the brain dated  October 17, 2011.  FINDINGS: CT HEAD FINDINGS  The ventricles are normal in size and position. There is very mild age-appropriate diffuse atrophy. There is decreased density in the deep white matter of both cerebral hemispheres consistent with chronic small vessel ischemic type change. There is no evidence of an acute intracranial hemorrhage. The cerebellum and brainstem are normal in density.  At bone window settings the observed portions of the paranasal sinuses reveal small amounts of mucoperiosteal thickening and fluid in the right maxillary sinus and within the sphenoid sinuses with mucoperiosteal thickening in the ethmoid sinuses.  CT CERVICAL SPINE FINDINGS  The cervical vertebral bodies are preserved in height. There is an exaggerated cervical lordosis likely due to the patient's severe thoracic kyphosis. There is disc space narrowing at C4-5 and to a lesser extent at C5-6. The prevertebral soft tissue spaces appear normal. There is no evidence of a perched facet nor spinous process fracture. The odontoid is intact and the lateral masses of C1 align normally with those of C2. The bony ring at each cervical level is intact. No soft tissue hematoma of the visualized portions of the neck is demonstrated. The pulmonary apices are clear. On the lateral film partial compression of the body of T4 is visible.  IMPRESSION: 1. There are stable chronic changes within the brain. There is no evidence of an acute ischemic or hemorrhagic infarction. 2. There are findings within the paranasal sinuses which are not new but which likelyreflect both acute and chronic sinusitis. 3. There is no evidence of an acute cervical spine fracture nor dislocation. There are mild degenerative disc changes in the mid cervical spine. There is partial compression of the body of T4 which was visible on a and lateral chest x-ray dated Feb 01, 2013. Marland Kitchen   Electronically Signed   By: David  [place]   On: 10/03/2013 08:05   Ct Cervical Spine Wo  Contrast  10/03/2013   CLINICAL DATA:  Right hip pain status post unwitnessed fall  EXAM: CT HEAD WITHOUT CONTRAST  CT CERVICAL SPINE WITHOUT CONTRAST  TECHNIQUE: Multidetector CT imaging of the head and cervical spine was performed following the standard protocol without intravenous contrast. Multiplanar CT image reconstructions of the cervical spine were also generated.  COMPARISON:  Noncontrast CT scan of the brain dated October 17, 2011.  FINDINGS: CT HEAD FINDINGS  The ventricles are normal in size  and position. There is very mild age-appropriate diffuse atrophy. There is decreased density in the deep white matter of both cerebral hemispheres consistent with chronic small vessel ischemic type change. There is no evidence of an acute intracranial hemorrhage. The cerebellum and brainstem are normal in density.  At bone window settings the observed portions of the paranasal sinuses reveal small amounts of mucoperiosteal thickening and fluid in the right maxillary sinus and within the sphenoid sinuses with mucoperiosteal thickening in the ethmoid sinuses.  CT CERVICAL SPINE FINDINGS  The cervical vertebral bodies are preserved in height. There is an exaggerated cervical lordosis likely due to the patient's severe thoracic kyphosis. There is disc space narrowing at C4-5 and to a lesser extent at C5-6. The prevertebral soft tissue spaces appear normal. There is no evidence of a perched facet nor spinous process fracture. The odontoid is intact and the lateral masses of C1 align normally with those of C2. The bony ring at each cervical level is intact. No soft tissue hematoma of the visualized portions of the neck is demonstrated. The pulmonary apices are clear. On the lateral film partial compression of the body of T4 is visible.  IMPRESSION: 1. There are stable chronic changes within the brain. There is no evidence of an acute ischemic or hemorrhagic infarction. 2. There are findings within the paranasal sinuses  which are not new but which likelyreflect both acute and chronic sinusitis. 3. There is no evidence of an acute cervical spine fracture nor dislocation. There are mild degenerative disc changes in the mid cervical spine. There is partial compression of the body of T4 which was visible on a and lateral chest x-ray dated Feb 01, 2013. Marland Kitchen.   Electronically Signed   By: David  [place]   On: 10/03/2013 08:05   Dg Pelvis Portable  10/04/2013   CLINICAL DATA:  Postop evaluation.  EXAM: PORTABLE PELVIS 1-2 VIEWS  COMPARISON:  DG C-ARM 1-60 MIN dated 10/04/2013  FINDINGS: Patient is status post open reduction and internal fixation. Right hip fracture. The hardware appears intact without evidence of loosening or failure. The native osseous structures are grossly unremarkable. A moderate large amount of stool is appreciated within the colon.  IMPRESSION: Patient is status post open reduction internal fixation right hip fracture.   Electronically Signed   By: Salome HolmesHector  Cooper M.D.   On: 10/04/2013 17:28   Dg Femur Right Port  10/04/2013   CLINICAL DATA:  Postop  EXAM: PORTABLE RIGHT FEMUR - 2 VIEW  COMPARISON:  10/03/2013  FINDINGS: Stable IM rod with dynamic hip screw fixation of a prior proximal femur fracture.  Interval placement of two distal interlocking screws.  Periprosthetic mid femoral shaft fracture is minimally displaced.  Visualized soft tissues are grossly unremarkable.  IMPRESSION: Prior IM rod with dynamic hip screw fixation, now with interval placement of two distal interlocking screws.  Periprosthetic mid femoral shaft fracture, minimally displaced.   Electronically Signed   By: Charline BillsSriyesh  Krishnan M.D.   On: 10/04/2013 17:34   Dg C-arm 1-60 Min  10/04/2013   CLINICAL DATA:  Right femur fracture  EXAM: DG C-ARM 1-60 MIN; RIGHT FEMUR - 2 VIEW  COMPARISON:  Preoperative radiographs 10/03/2013  FINDINGS: Revision ORIF of right periprosthetic femoral fracture. Two distal interlocking screws are now present within  the intra medullary nail. Alignment of fracture fragments appears improved. The proximal aspect of the intra medullary nail is not included in the field of view.  IMPRESSION: Revision ORIF of right periprosthetic femoral  fracture as above.   Electronically Signed   By: Malachy Moan M.D.   On: 10/04/2013 17:11    Microbiology: Recent Results (from the past 240 hour(s))  URINE CULTURE     Status: None   Collection Time    10/03/13  2:52 AM      Result Value Range Status   Specimen Description URINE, CLEAN CATCH   Final   Special Requests NONE   Final   Culture  Setup Time     Final   Value: 10/03/2013 08:19     Performed at [facility] Count     Final   Value: >=100,000 COLONIES/ML     Performed at [facility]   Culture     Final   Value: ESCHERICHIA COLI     Performed at [facility]   Report Status 10/04/2013 FINAL   Final   Organism ID, Bacteria ESCHERICHIA COLI   Final  SURGICAL PCR SCREEN     Status: None   Collection Time    10/04/13  3:25 AM      Result Value Range Status   MRSA, PCR NEGATIVE  NEGATIVE Final   Staphylococcus aureus NEGATIVE  NEGATIVE Final   Comment:            The Xpert SA Assay (FDA     approved for NASAL specimens     in patients over 52 years of age),     is one component of     a comprehensive surveillance     program.  Test performance has     been validated by [facility] for patients greater     than or equal to 40 year old.     It is not intended     to diagnose infection nor to     guide or monitor treatment.     Labs: Basic Metabolic Panel:  Recent Labs Lab 10/03/13 0309 10/04/13 0601 10/05/13 0404  NA 140 136* 139  K 4.4 4.3 4.6  CL 100 100 103  CO2 26 26 25   GLUCOSE 132* 122* 91  BUN 22 21 14   CREATININE 0.88 0.88 0.84  CALCIUM 9.5 8.6 8.7   Liver Function Tests: No results found for this basename: AST, ALT, ALKPHOS, BILITOT, PROT, ALBUMIN,  in the last 168 hours No results  found for this basename: LIPASE, AMYLASE,  in the last 168 hours No results found for this basename: AMMONIA,  in the last 168 hours CBC:  Recent Labs Lab 10/03/13 0309 10/04/13 0601 10/05/13 0404  WBC 10.9* 7.6 6.1  NEUTROABS 8.2*  --   --   HGB 13.6 10.5* 9.7*  HCT 40.4 31.0* 29.0*  MCV 95.3 94.5 94.5  PLT 204 167 148*   Cardiac Enzymes:  Recent Labs Lab 10/04/13 1625 10/04/13 2204 10/05/13 0404  TROPONINI 0.38* 0.30* <0.30   BNP: BNP (last 3 results) No results found for this basename: PROBNP,  in the last 8760 hours CBG: No results found for this basename: GLUCAP,  in the last 168 hours     Signed:  Cayleigh Paull S  [facility] 10/05/2013, 1:34 PM

## 2013-10-05 NOTE — Progress Notes (Signed)
Subjective:  No chest pain. Able to move right leg. Echocardiogram showed good LV systolic function. Cardiac enzymes near normal x 3. Patient against invasive cardiac work up. Monitor and EKG shows Atrial fibrillation with control ventricular response and inferolateral ischemia.  Objective:  Vital Signs in the last 24 hours: Temp:  [97.5 F (36.4 C)-98.5 F (36.9 C)] 97.5 F (36.4 C) (01/28 0400) Pulse Rate:  [65-103] 70 (01/28 0700) Cardiac Rhythm:  [-] Atrial fibrillation (01/27 2030) Resp:  [11-29] 20 (01/28 0736) BP: (87-135)/(33-92) 92/48 mmHg (01/28 0700) SpO2:  [95 %-100 %] 100 % (01/28 0736) Weight:  [48.5 kg (106 lb 14.8 oz)] 48.5 kg (106 lb 14.8 oz) (01/28 0400)  Physical Exam: BP Readings from Last 1 Encounters:  10/05/13 92/48     Wt Readings from Last 1 Encounters:  10/05/13 48.5 kg (106 lb 14.8 oz)    Weight change: -13.2 kg (-29 lb 1.6 oz)  HEENT: [place]/AT, Eyes- Conjunctiva-Pale pink, Sclera-Non-icteric Neck: No JVD, No bruit, Trachea midline. Lungs:  Clear, Bilateral. Cardiac:  Regular rhythm, normal S1 and S2, no S3.  Abdomen:  Soft, non-tender. Extremities:  No edema present. No cyanosis. No clubbing. Small scar of surgery above right knee laterally with small dressing. CNS: AxOx3, Cranial nerves grossly intact, moves all 4 extremities. Right handed. Skin: Warm and dry.   Intake/Output from previous day: 01/27 0701 - 01/28 0700 In: 414.6 [I.V.:364.6; IV Piggyback:50] Out: 900 [Urine:900]    Lab Results: BMET    Component Value Date/Time   NA 139 10/05/2013 0404   K 4.6 10/05/2013 0404   CL 103 10/05/2013 0404   CO2 25 10/05/2013 0404   GLUCOSE 91 10/05/2013 0404   BUN 14 10/05/2013 0404   CREATININE 0.84 10/05/2013 0404   CALCIUM 8.7 10/05/2013 0404   GFRNONAA 59* 10/05/2013 0404   GFRAA 68* 10/05/2013 0404   CBC    Component Value Date/Time   WBC 6.1 10/05/2013 0404   RBC 3.07* 10/05/2013 0404   HGB 9.7* 10/05/2013 0404   HCT 29.0* 10/05/2013 0404   PLT 148* 10/05/2013 0404   MCV 94.5 10/05/2013 0404   MCH 31.6 10/05/2013 0404   MCHC 33.4 10/05/2013 0404   RDW 14.3 10/05/2013 0404   LYMPHSABS 2.0 10/03/2013 0309   MONOABS 0.6 10/03/2013 0309   EOSABS 0.1 10/03/2013 0309   BASOSABS 0.0 10/03/2013 0309   CARDIAC ENZYMES Lab Results  Component Value Date   TROPONINI <0.30 10/05/2013    Scheduled Meds: . aspirin EC  325 mg Oral Q breakfast  .  ceFAZolin (ANCEF) IV  1 g Intravenous Q12H  . citalopram  10 mg Oral Daily  . docusate sodium  100 mg Oral BID  . famotidine  20 mg Oral Daily  . hydrALAZINE  25 mg Oral TID  . lamoTRIgine  25 mg Oral BID  . levothyroxine  88 mcg Oral QAC breakfast  . lisinopril  5 mg Oral q morning - 10a  . multivitamin with minerals  1 tablet Oral q morning - 10a  . risperiDONE  0.25 mg Oral QHS  . Rivaroxaban  20 mg Oral q morning - 10a  . simvastatin  10 mg Oral Daily  . tamsulosin  0.4 mg Oral Daily   Continuous Infusions: . sodium chloride 20 mL (10/04/13 1830)   PRN Meds:.acetaminophen, acetaminophen, fentaNYL, HYDROcodone-acetaminophen, menthol-cetylpyridinium, morphine injection, ondansetron (ZOFRAN) IV, ondansetron, phenol, polyethylene glycol  Assessment/Plan: Chronic atrial fibrillation with controlled ventricular response  RBBB  Possible old ASWMI  Possible  NSTEMI  S/P Right hip and lower end of femur fracture  Hypothyroidism UTI  DC Foley. Increase activity. May discharge per cardiology with aspirin and rivaroxaban    LOS: 2 days    Orpah CobbAjay Alysia Scism  MD  10/05/2013, 8:12 AM

## 2013-10-05 NOTE — Progress Notes (Signed)
ANTICOAGULATION CONSULT NOTE  Pharmacy Consult for heparin Indication: chest pain/ACS  No Known Allergies  Patient Measurements: Height: 5' 4.96" (165 cm) (From 08/19/13) Weight: 138 lb 0.1 oz (62.6 kg) IBW/kg (Calculated) : 56.91   Vital Signs: Temp: 98.4 F (36.9 C) (01/28 0000) Temp src: Oral (01/28 0000) BP: 104/33 mmHg (01/28 0000) Pulse Rate: 68 (01/28 0000)  Labs:  Recent Labs  10/03/13 0309 10/04/13 0601 10/04/13 1625 10/04/13 2204 10/05/13 0048  HGB 13.6 10.5*  --   --   --   HCT 40.4 31.0*  --   --   --   PLT 204 167  --   --   --   APTT  --   --   --   --  58*  LABPROT 14.2  --   --   --   --   INR 1.12  --   --   --   --   HEPARINUNFRC  --   --   --   --  0.16*  CREATININE 0.88 0.88  --   --   --   TROPONINI  --   --  0.38* 0.30*  --     Estimated Creatinine Clearance: 37.4 ml/min (by C-G formula based on Cr of 0.88).  Assessment: 80 yo female with NSTEMI for heparin  Goal of Therapy:  Heparin level= 0.3-0.5 Monitor platelets by anticoagulation protocol: Yes   Plan:  Increase Heparin 900 units/hr Check heparin level in 8 hours.  Geannie RisenGreg Kacy Conely, PharmD, BCPS   10/05/2013 1:57 AM

## 2013-10-05 NOTE — Progress Notes (Addendum)
CSW received call from [facility] that they received insurance auth. CSW requested discharge summary from MD. MD states he will do this after lunch. CSW will print discharge summary and add to packet when it is complete.   Addendum: [facility] not see the [facility] note. CSW [facility]ubmitted it to "[facility] [employer] [facility]" -- [facility]" and rep states this is not the correct venue. Rep states she received PT note and [facility] [id number] (wrote this on PTAR form). Facility informed of pt's discharge, and discharge summary sent on [facility]. PTAR scheduled. CSW signing off.  Maryclare LabradorJulie Randye Treichler, MSW, [facility] Clinical Social Worker [phone]

## 2013-10-05 NOTE — Progress Notes (Signed)
Subjective:  Patient reports pain as mild  Objective:   VITALS:   Filed Vitals:   10/05/13 0357 10/05/13 0400 10/05/13 0700 10/05/13 0736  BP:  90/44 92/48   Pulse:  78 70   Temp:  97.5 F (36.4 C)    TempSrc:  Oral    Resp: 12 17 15 20   Height:      Weight:  48.5 kg (106 lb 14.8 oz)    SpO2: 99% 99% 100% 100%    Physical Exam   Dressing: C/D/I  Compartments soft  SILT DP/SP/S/S/T, 2+DP, +TA/GS/EHL  LABS  Results for orders placed during the hospital encounter of 10/03/13 (from the past 24 hour(s))  TSH     Status: None   Collection Time    10/04/13  4:25 PM      Result Value Range   TSH 4.490  0.350 - 4.500 uIU/mL  TROPONIN I     Status: Abnormal   Collection Time    10/04/13  4:25 PM      Result Value Range   Troponin I 0.38 (*) <0.30 ng/mL  TROPONIN I     Status: Abnormal   Collection Time    10/04/13 10:04 PM      Result Value Range   Troponin I 0.30 (*) <0.30 ng/mL  HEPARIN LEVEL (UNFRACTIONATED)     Status: Abnormal   Collection Time    10/05/13 12:48 AM      Result Value Range   Heparin Unfractionated 0.16 (*) 0.30 - 0.70 IU/mL  APTT     Status: Abnormal   Collection Time    10/05/13 12:48 AM      Result Value Range   aPTT 58 (*) 24 - 37 seconds  TROPONIN I     Status: None   Collection Time    10/05/13  4:04 AM      Result Value Range   Troponin I <0.30  <0.30 ng/mL  CBC     Status: Abnormal   Collection Time    10/05/13  4:04 AM      Result Value Range   WBC 6.1  4.0 - 10.5 K/uL   RBC 3.07 (*) 3.87 - 5.11 MIL/uL   Hemoglobin 9.7 (*) 12.0 - 15.0 g/dL   HCT 16.1 (*) 09.6 - 04.5 %   MCV 94.5  78.0 - 100.0 fL   MCH 31.6  26.0 - 34.0 pg   MCHC 33.4  30.0 - 36.0 g/dL   RDW 40.9  81.1 - 91.4 %   Platelets 148 (*) 150 - 400 K/uL  BASIC METABOLIC PANEL     Status: Abnormal   Collection Time    10/05/13  4:04 AM      Result Value Range   Sodium 139  137 - 147 mEq/L   Potassium 4.6  3.7 - 5.3 mEq/L   Chloride 103  96 - 112 mEq/L   CO2  25  19 - 32 mEq/L   Glucose, Bld 91  70 - 99 mg/dL   BUN 14  6 - 23 mg/dL   Creatinine, Ser 7.82  0.50 - 1.10 mg/dL   Calcium 8.7  8.4 - 95.6 mg/dL   GFR calc non Af Amer 59 (*) >90 mL/min   GFR calc Af Amer 68 (*) >90 mL/min     Assessment/Plan: 1 Day Post-Op   Active Problems:   Anxiety   Elevated LFTs   Hypothyroidism   Hypercholesterolemia   Hip fracture, right  Acute renal failure   Hip fracture   Atrial flutter   PLAN: Weight Bearing: WBAT Dressings: change to a dry dressing day of discharge per nursing VTE prophylaxis: I ordered [id number] but there is no orthopedic contraindication to increased chemical px i.e. Heparin of lovenox/coumadin at the discretion of med/cards teams.  Dispo: ok with dispo to snf from ortho standpoint as early as today.    Yvonne Donovan, TIMOTHY, D 10/05/2013, 8:26 AM   Yvonne Ranaimothy Murphy, MD Cell [phone]

## 2013-10-05 NOTE — Progress Notes (Signed)
eLink Physician-Brief Progress Note Patient Name: Yvonne HawkHelen Donovan DOB: Apr 26, 1920 MRN: [id number]  Date of Service  10/05/2013   HPI/Events of Note   Urine culture with > 100K cfu e.coli U/A with TNTC WBC  eICU Interventions  Will restart cefazolin for now, have asked RN to consult with rounding team regarding duration, Abx choice, etc   Intervention Category Major Interventions: Infection - evaluation and management  MCQUAID, DOUGLAS 10/05/2013, 1:19 AM

## 2013-10-06 ENCOUNTER — Encounter (HOSPITAL_COMMUNITY): Payer: Self-pay | Admitting: Orthopedic Surgery

## 2013-10-07 ENCOUNTER — Encounter (HOSPITAL_COMMUNITY): Payer: Self-pay | Admitting: Orthopedic Surgery

## 2013-11-16 ENCOUNTER — Encounter (HOSPITAL_COMMUNITY): Payer: Self-pay | Admitting: Orthopedic Surgery

## 2013-11-16 NOTE — Addendum Note (Signed)
Addendum created 11/16/13 1801 by Judie Petitharlene Edwards, MD   Modules edited: Anesthesia Events

## 2014-11-22 IMAGING — RF DG HIP OPERATIVE*R*
1 series · 4 of 4 positions shown · non-contrast
Comparison: 08/19/2013

CLINICAL DATA: Right hip intertrochanteric fracture, operative
fixation

EXAM:
DG OPERATIVE RIGHT HIP
TECHNIQUE: A single spot fluoroscopic AP image of the right hip is submitted.

[Series 1: run · 4 of 4 slices shown]
[im 1/4]
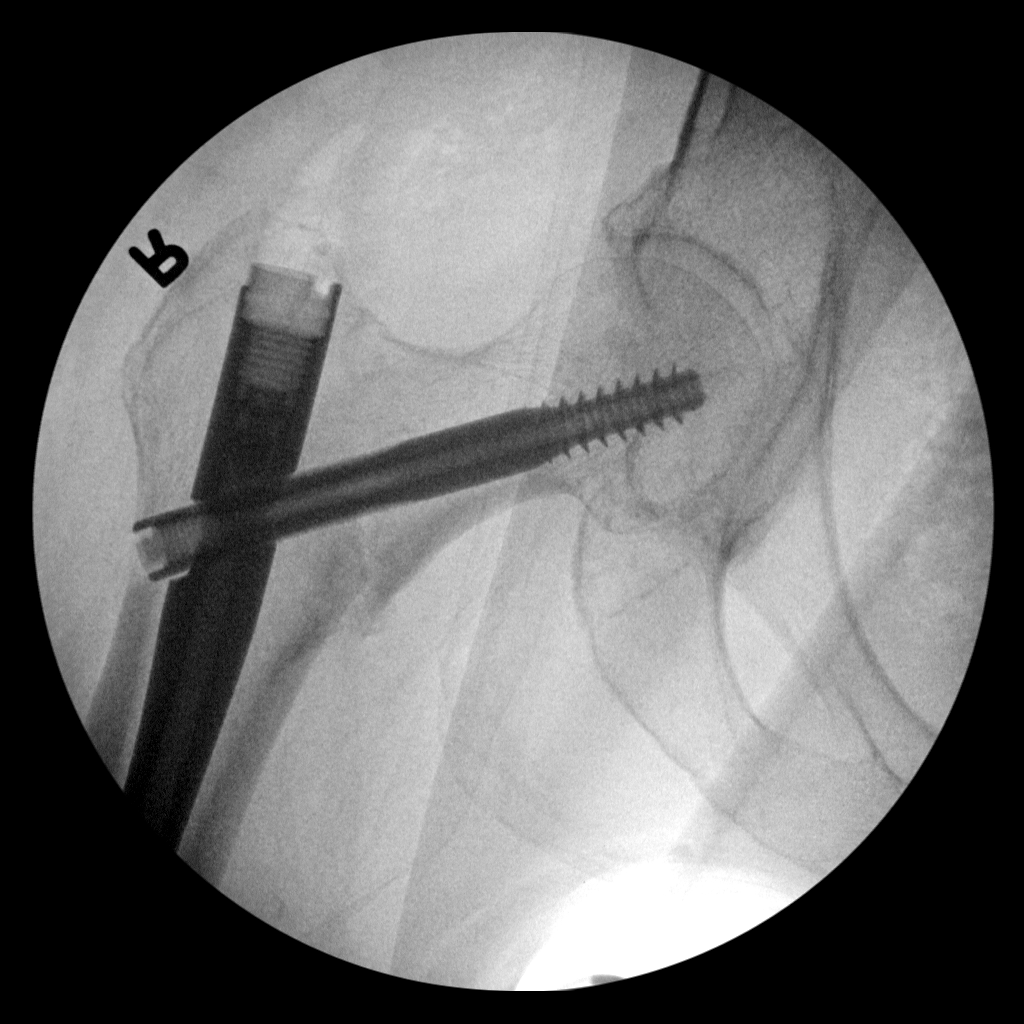
[im 2/4]
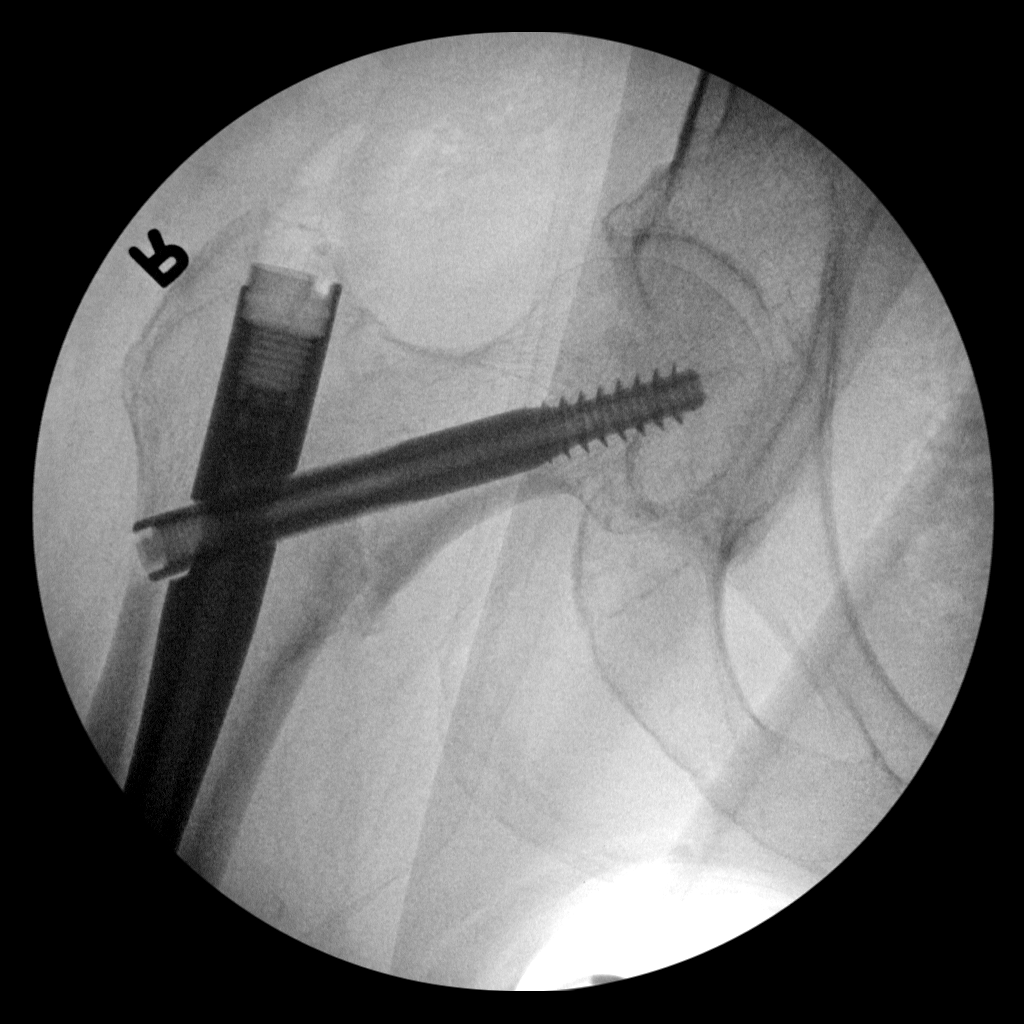
[im 3/4]
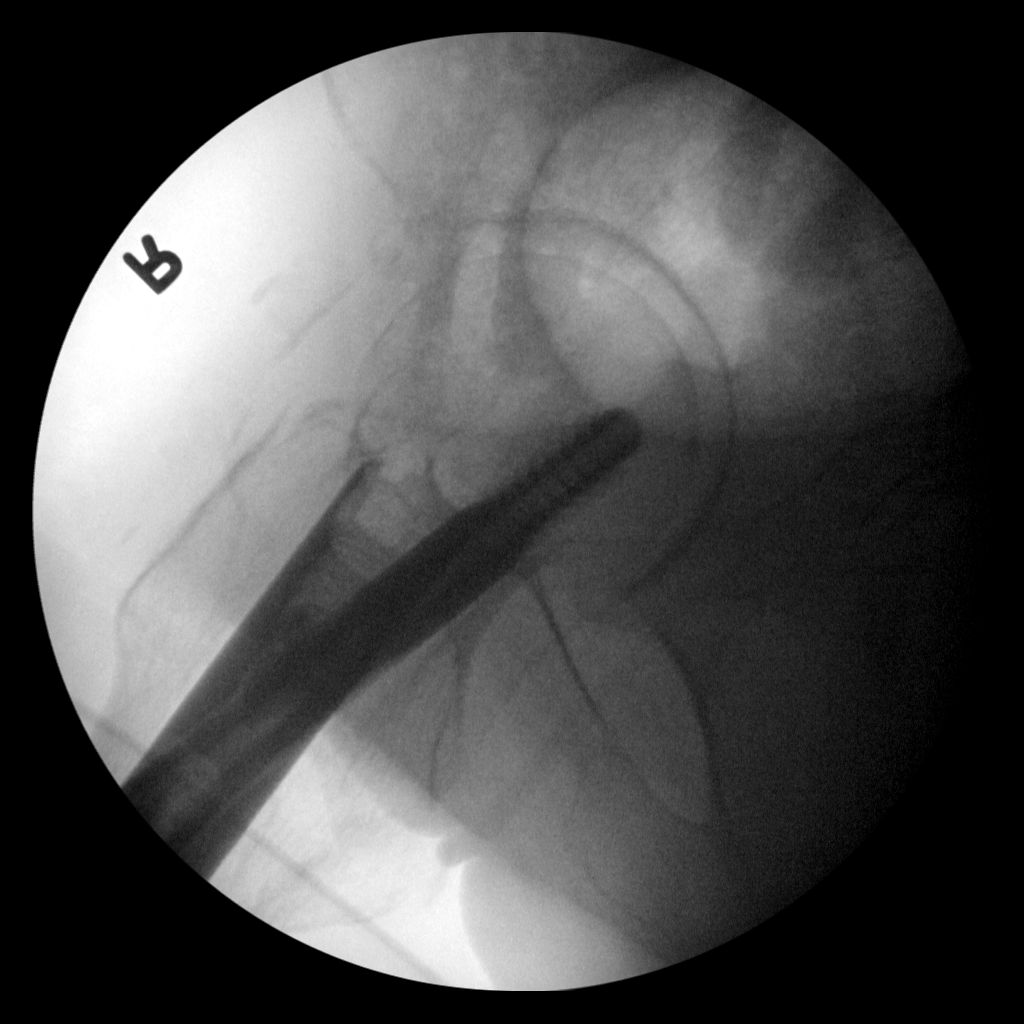
[im 4/4]
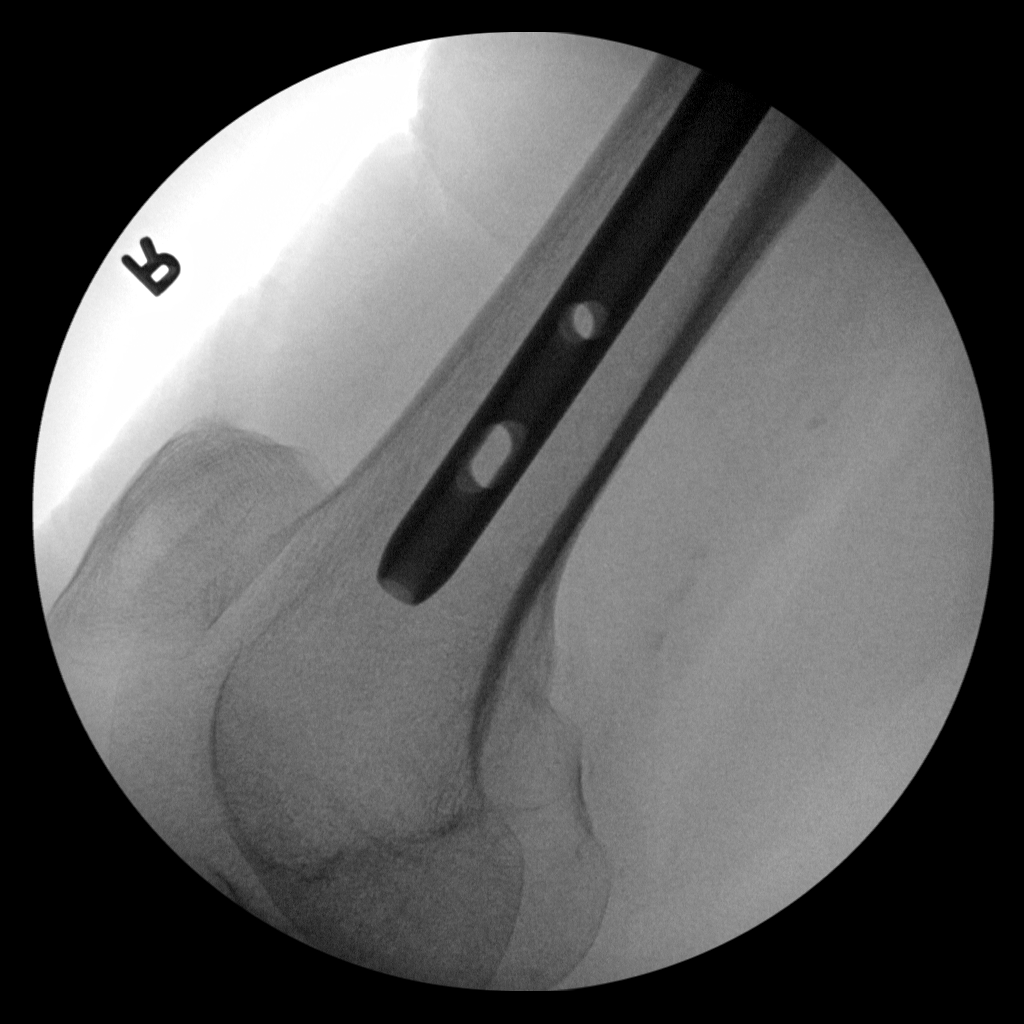

[4 of 4 positions shown; findings below may reference images not displayed]

FINDINGS: Spot fluoroscopic intraoperative views demonstrate right femur intra
medullary rod and screw fixation through the right hip
intertrochanteric fracture. Alignment appears anatomic. Fracture
line remains visible along the inferior margin. No acute hardware
abnormality.
IMPRESSION: Status post ORIF of the right hip intertrochanteric fracture. No
complicating feature.

## 2015-01-06 IMAGING — CR DG PORTABLE PELVIS
1 series · 1 of 1 positions shown · non-contrast
Comparison: DG C-ARM 1-60 MIN dated 10/04/2013

CLINICAL DATA: Postop evaluation.

EXAM:
PORTABLE PELVIS 1-2 VIEWS

[AP]
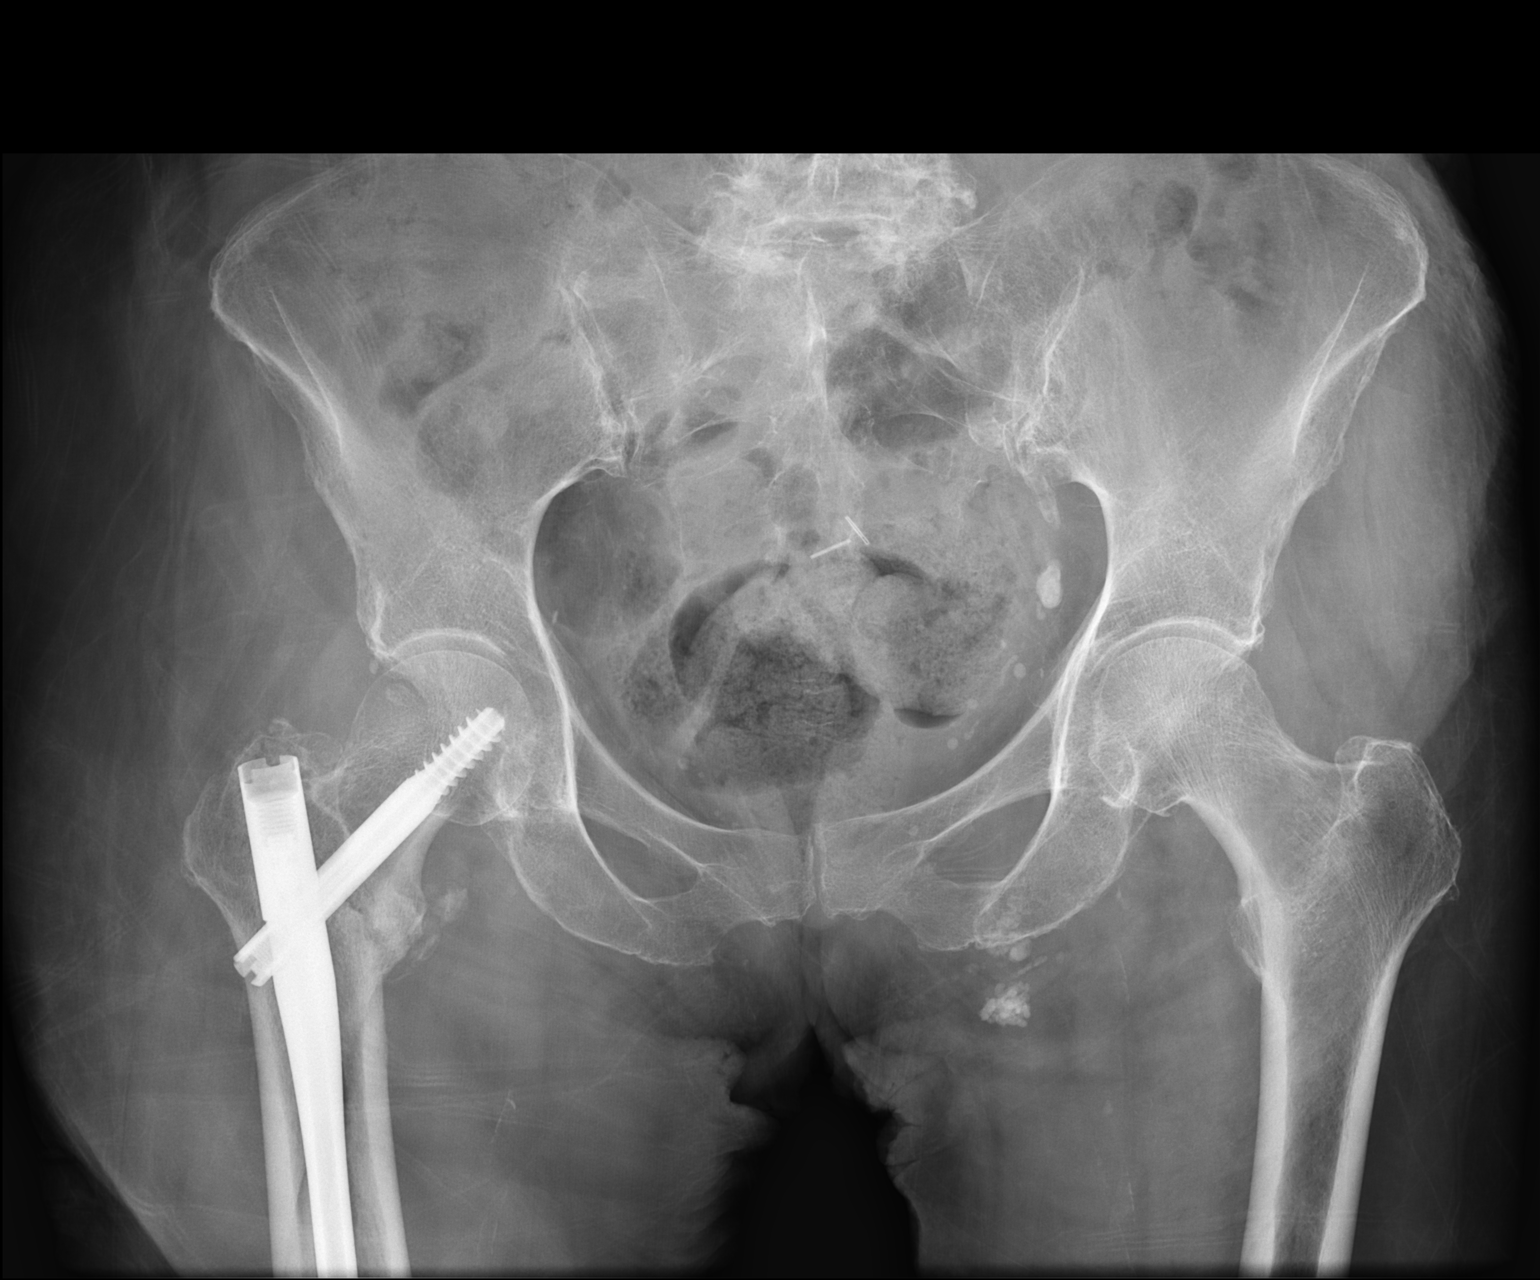

[1 of 1 positions shown; findings below may reference images not displayed]

FINDINGS: Patient is status post open reduction and internal fixation. Right
hip fracture. The hardware appears intact without evidence of
loosening or failure. The native osseous structures are grossly
unremarkable. A moderate large amount of stool is appreciated within
the colon.
IMPRESSION: Patient is status post open reduction internal fixation right hip
fracture.

## 2015-01-06 IMAGING — CR DG FEMUR 2+V PORT*R*
5 series · 5 of 5 positions shown · non-contrast
Comparison: 10/03/2013

CLINICAL DATA: Postop

EXAM:
PORTABLE RIGHT FEMUR - 2 VIEW

[AP (1 of 2)]
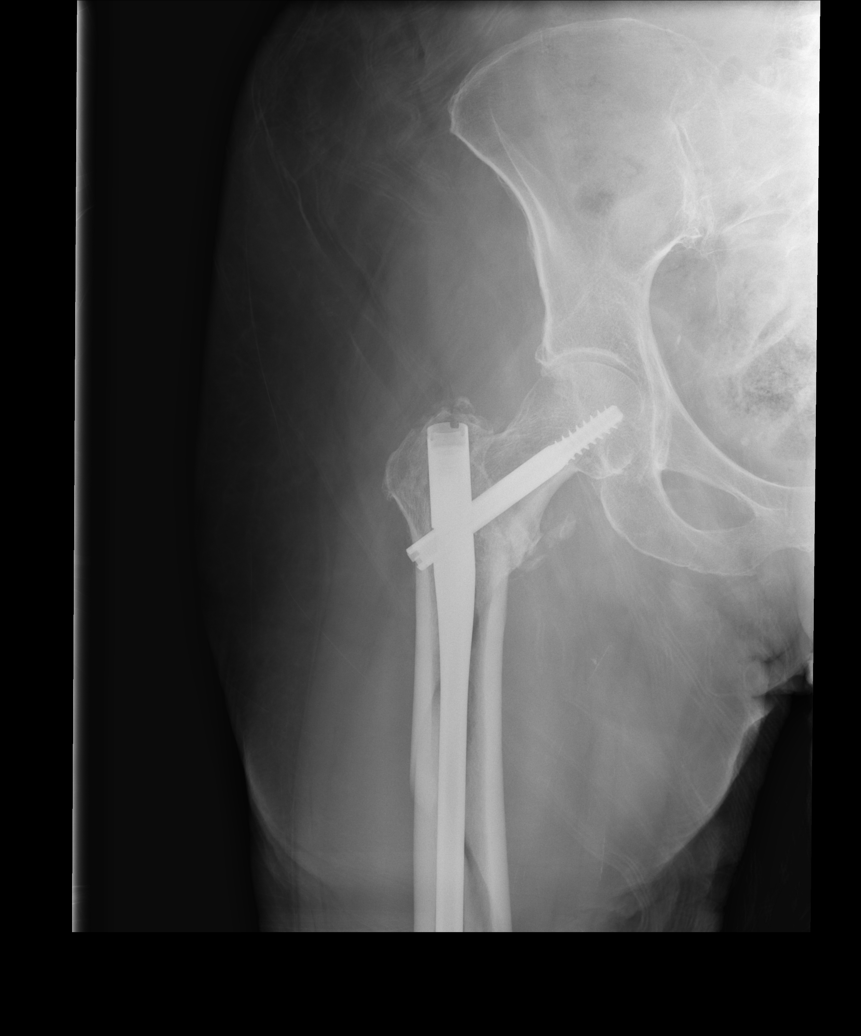

[xtable lateral (1 of 3)]
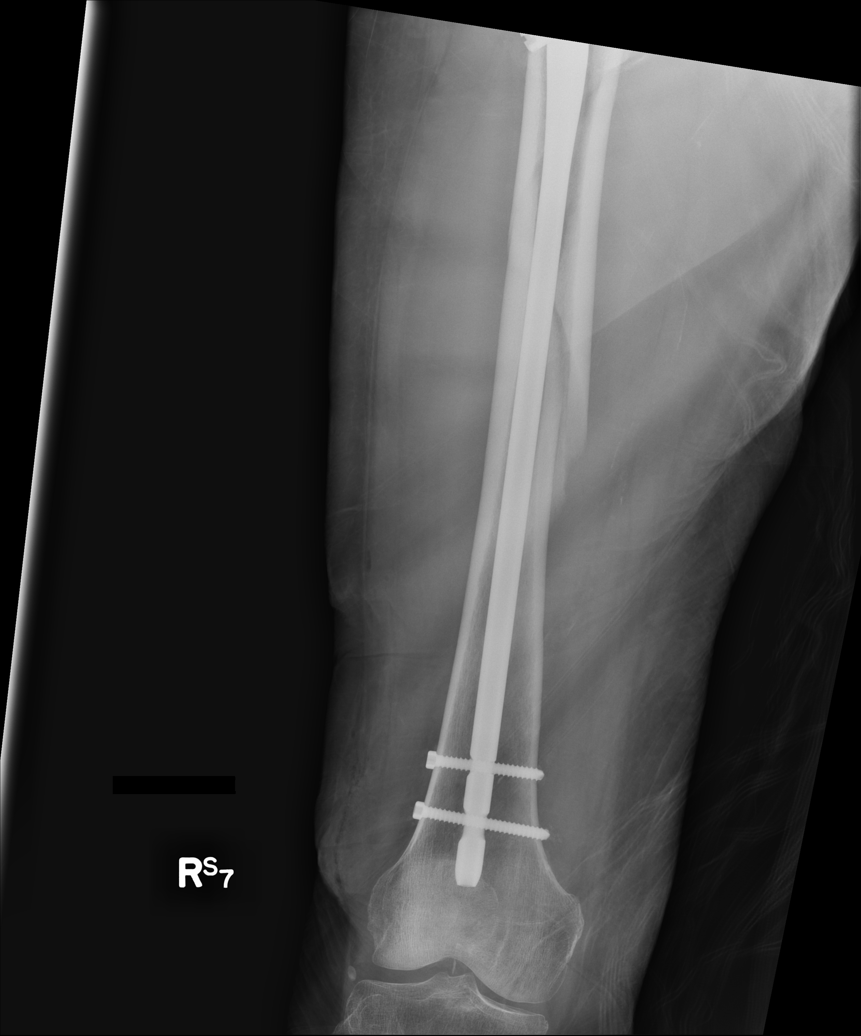

[AP (2 of 2)]
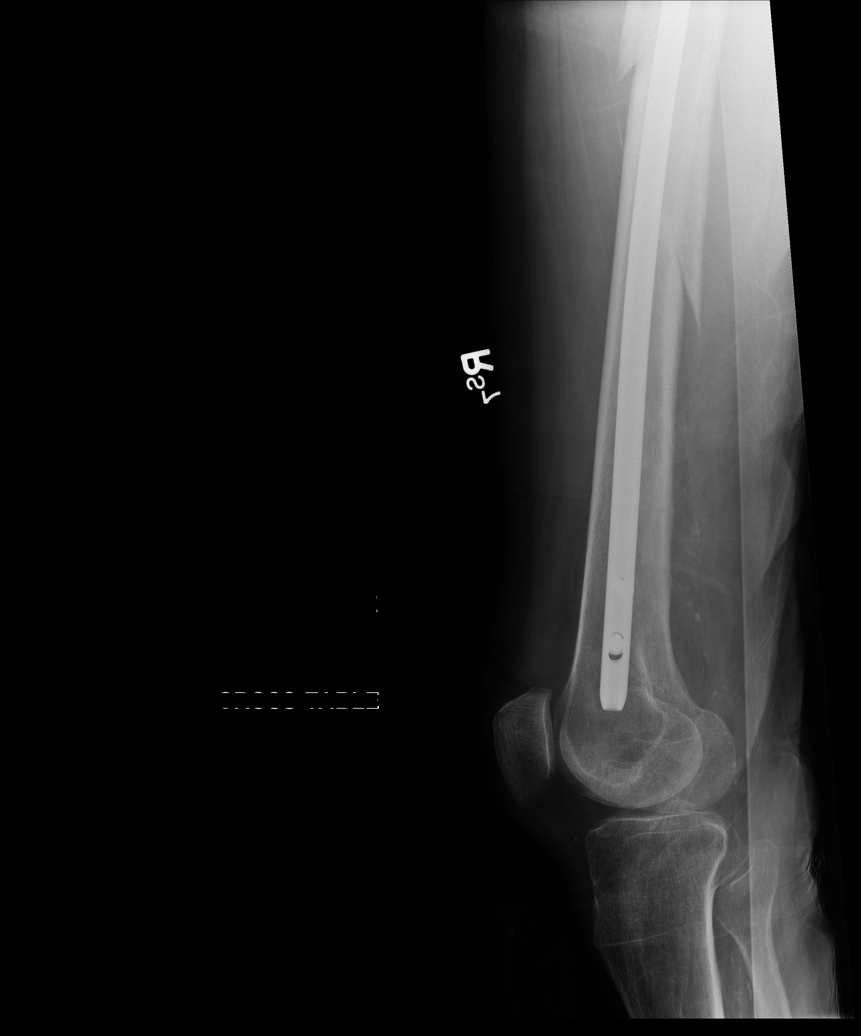

[xtable lateral (2 of 3)]
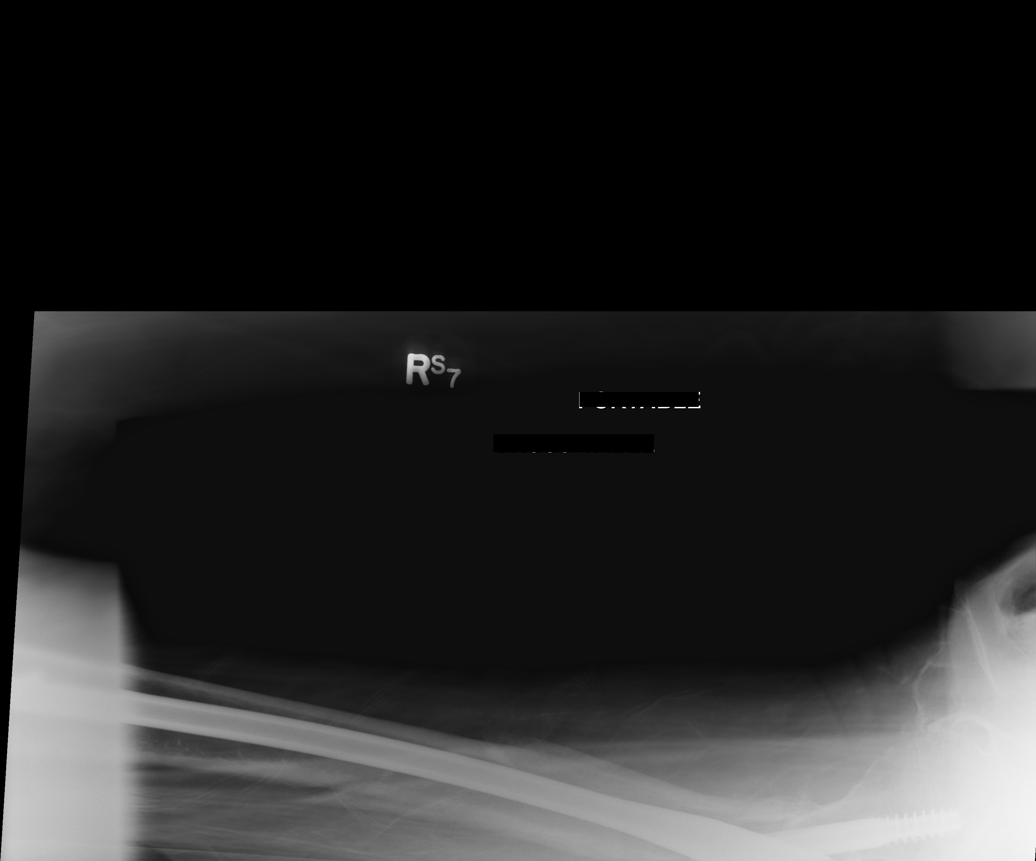

[xtable lateral (3 of 3)]
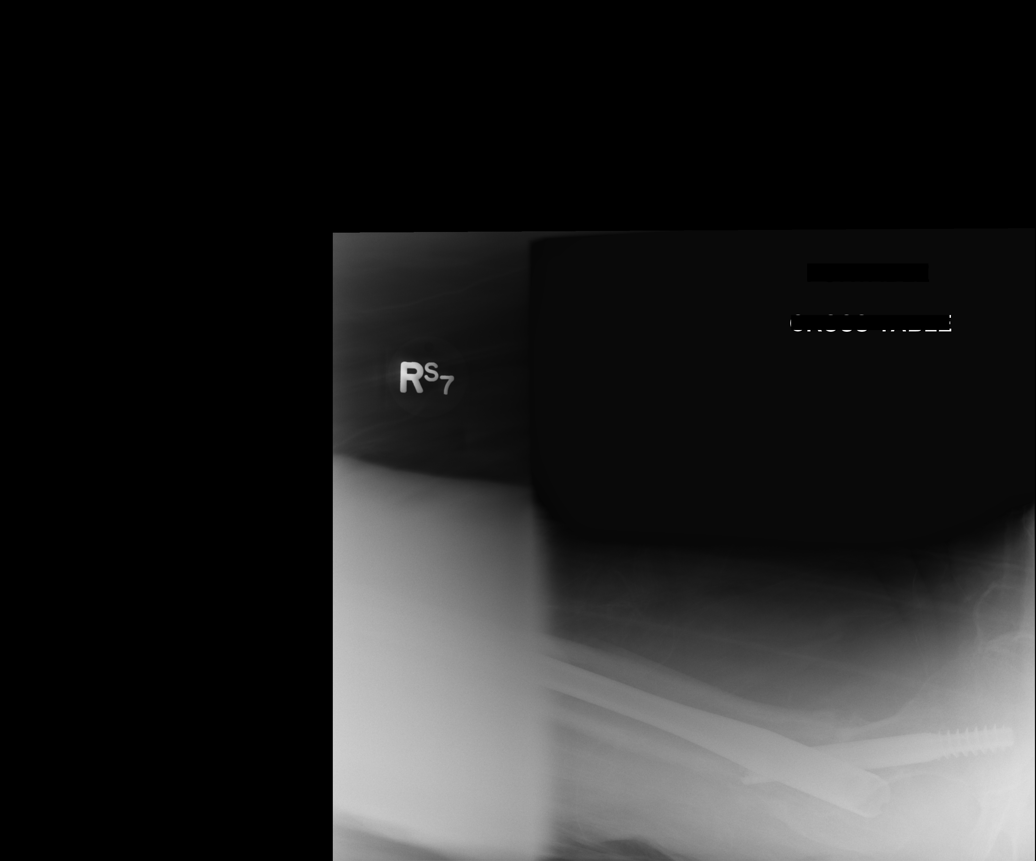

[5 of 5 positions shown; findings below may reference images not displayed]

FINDINGS: Stable IM rod with dynamic hip screw fixation of a prior proximal
femur fracture.

Interval placement of two distal interlocking screws.

Periprosthetic mid femoral shaft fracture is minimally displaced.

Visualized soft tissues are grossly unremarkable.
IMPRESSION: Prior IM rod with dynamic hip screw fixation, now with interval
placement of two distal interlocking screws.

Periprosthetic mid femoral shaft fracture, minimally displaced.

## 2015-11-22 ENCOUNTER — Emergency Department (HOSPITAL_COMMUNITY): Payer: Medicare Other

## 2015-11-22 ENCOUNTER — Emergency Department (HOSPITAL_COMMUNITY)
Admission: EM | Admit: 2015-11-22 | Discharge: 2015-12-08 | Disposition: E | Payer: Medicare Other | Attending: Emergency Medicine | Admitting: Emergency Medicine

## 2015-11-22 ENCOUNTER — Encounter (HOSPITAL_COMMUNITY): Payer: Self-pay | Admitting: Emergency Medicine

## 2015-11-22 DIAGNOSIS — Z862 Personal history of diseases of the blood and blood-forming organs and certain disorders involving the immune mechanism: Secondary | ICD-10-CM | POA: Insufficient documentation

## 2015-11-22 DIAGNOSIS — K219 Gastro-esophageal reflux disease without esophagitis: Secondary | ICD-10-CM | POA: Diagnosis not present

## 2015-11-22 DIAGNOSIS — Z7982 Long term (current) use of aspirin: Secondary | ICD-10-CM | POA: Insufficient documentation

## 2015-11-22 DIAGNOSIS — E039 Hypothyroidism, unspecified: Secondary | ICD-10-CM | POA: Insufficient documentation

## 2015-11-22 DIAGNOSIS — I10 Essential (primary) hypertension: Secondary | ICD-10-CM | POA: Diagnosis not present

## 2015-11-22 DIAGNOSIS — A419 Sepsis, unspecified organism: Secondary | ICD-10-CM | POA: Insufficient documentation

## 2015-11-22 DIAGNOSIS — Z79899 Other long term (current) drug therapy: Secondary | ICD-10-CM | POA: Insufficient documentation

## 2015-11-22 DIAGNOSIS — E785 Hyperlipidemia, unspecified: Secondary | ICD-10-CM | POA: Insufficient documentation

## 2015-11-22 DIAGNOSIS — R55 Syncope and collapse: Secondary | ICD-10-CM | POA: Diagnosis present

## 2015-11-22 DIAGNOSIS — I509 Heart failure, unspecified: Secondary | ICD-10-CM | POA: Diagnosis not present

## 2015-11-22 DIAGNOSIS — Z87891 Personal history of nicotine dependence: Secondary | ICD-10-CM | POA: Diagnosis not present

## 2015-11-22 DIAGNOSIS — E782 Mixed hyperlipidemia: Secondary | ICD-10-CM | POA: Diagnosis not present

## 2015-11-22 LAB — I-STAT ARTERIAL BLOOD GAS, ED
ACID-BASE DEFICIT: 3 mmol/L — AB (ref 0.0–2.0)
BICARBONATE: 27.9 meq/L — AB (ref 20.0–24.0)
O2 Saturation: 98 %
PO2 ART: 155 mmHg — AB (ref 80.0–100.0)
TCO2: 30 mmol/L (ref 0–100)
pCO2 arterial: 86.3 mmHg (ref 35.0–45.0)
pH, Arterial: 7.122 — CL (ref 7.350–7.450)

## 2015-11-22 LAB — I-STAT CG4 LACTIC ACID, ED: Lactic Acid, Venous: 4.25 mmol/L (ref 0.5–2.0)

## 2015-11-22 MED ORDER — SODIUM CHLORIDE 0.9 % IV BOLUS (SEPSIS): 500.0000 mL | INTRAVENOUS | Status: DC

## 2015-11-22 MED ORDER — SODIUM CHLORIDE 0.9 % IV BOLUS (SEPSIS)
1000.0000 mL | Freq: Once | INTRAVENOUS | Status: AC
  Administered 2015-11-22: 1000 mL via INTRAVENOUS

## 2015-11-22 MED ORDER — VANCOMYCIN HCL IN DEXTROSE 1-5 GM/200ML-% IV SOLN: 1000.0000 mg | Freq: Once | INTRAVENOUS | Status: DC

## 2015-11-22 MED ORDER — IPRATROPIUM BROMIDE 0.02 % IN SOLN
1.0000 mg | Freq: Once | RESPIRATORY_TRACT | Status: DC
  Filled 2015-11-22: qty 5

## 2015-11-22 MED ORDER — PIPERACILLIN-TAZOBACTAM 3.375 G IVPB 30 MIN: 3.3750 g | Freq: Once | INTRAVENOUS | Status: DC

## 2015-11-22 MED ORDER — ALBUTEROL (5 MG/ML) CONTINUOUS INHALATION SOLN
10.0000 mg/h | INHALATION_SOLUTION | RESPIRATORY_TRACT | Status: DC
  Filled 2015-11-22: qty 20

## 2015-12-08 NOTE — ED Notes (Signed)
This RN spoke with K. Godsil, at [phone], to advise pt's 2 pillows are labelled and here.  Ms Yvonne Donovan asked to pick them up at the front desk.

## 2015-12-08 NOTE — Progress Notes (Signed)
   07-17-16 0700  Clinical Encounter Type  Visited With Family;Patient not available;Health care provider  Visit Type Initial;ED;Death  Referral From Nurse  Spiritual Encounters  Spiritual Needs Brochure;Prayer;[facility];Emotional;Grief support  Stress Factors  Patient Stress Factors None identified  Family Stress Factors Loss   Chaplain responded to the death of pt in Trauma A. D[facility]-in-[facility], [facility] the [facility], as Pt son is away on business.  Chaplain accompanied family to visit with dr and pt bedside.  Chaplain offered empathetic listening, hospitality and literature on grief. Chaplain also offered information about the "Patient Placement Department."  Chaplain is available if further support is needed.   [employer] Xavian Hardcastle, [address] Hospital [address]haplain

## 2015-12-08 NOTE — ED Notes (Signed)
Pt in from [facility] nursing home. Pt fell approx 12 hr ago and C/O hip pain but did not come to hospital. Pt last seen normal 11pm, normally A/OX4. Pt was found unresponsive by staff at 0400, does not respond to any stimuli. Pt found 75% RA, placed on NRB. CBG 120

## 2015-12-08 NOTE — ED Notes (Signed)
Family at bedside with chaplain and EDP

## 2015-12-08 NOTE — ED Notes (Signed)
EDP at bedside, pt has no heart beat, time of death called. 0518.

## 2015-12-08 NOTE — ED Provider Notes (Addendum)
CSN: [id number]     Arrival date & time 12/12/15  0448 History   First MD Initiated Contact with Patient 12-Dec-2015 0449     Chief Complaint  Patient presents with  . Unresponsive    Level 5 Caveat: Acuity of Condition  (Consider location/radiation/quality/duration/timing/severity/associated sxs/prior Treatment) HPI  Yvonne Donovan is a 82 y.o. female with past medical history of hypertension, CHF, hyperlipidemia, presenting today with altered mental status. Patient is here from nursing home, history obtained by EMS due to the acuity of her condition. Per report, patient fell 12 hours ago and complained of hip pain. They did an x-ray which was normal but the patient did not come to the hospital. She was last seen normal around 11 PM when she was found unresponsive with agonal respirations. Oxygen was 75% on room air and patient was initially hypotensive per EMS. She was placed on a nonrebreather and her blood pressure spontaneously increased to 120 systolic. Patient transferred to emergency department for further care.   Past Medical History  Diagnosis Date  . Hypertension   . Constipation   . Pain   . Acid reflux   . Hypercholesteremia   . Hypothyroid   . CHF (congestive heart failure) (HCC)   . Bronchitis   . Hyperlipidemia   . Anemia    Past Surgical History  Procedure Laterality Date  . Wrist surgery    . Abdominal hysterectomy    . Intramedullary (im) nail intertrochanteric Right 08/20/2013    Procedure: INTRAMEDULLARY (IM) NAIL INTERTROCHANTRIC RIGHT HIP;  Surgeon: Loreta Ave, MD;  Location: [facility] OR;  Service: Orthopedics;  Laterality: Right;  . Orif femur fracture Right 10/04/2013    Procedure: OPEN REDUCTION INTERNAL FIXATION (ORIF) DISTAL FEMUR FRACTURE;  Surgeon: Sheral Apley, MD;  Location: [facility] OR;  Service: Orthopedics;  Laterality: Right;   No family history on file. Social History  Substance Use Topics  . Smoking status: Former Smoker    Quit date: 08/19/1944   . Smokeless tobacco: Never Used  . Alcohol Use: Yes     Comment: " a little wine when I go out to eat"   OB History    No data available     Review of Systems  Unable to perform ROS: Acuity of condition      Allergies  Review of patient's allergies indicates no known allergies.  Home Medications   Prior to Admission medications   Medication Sig Start Date End Date Taking? Authorizing Provider  aspirin EC 325 MG tablet Take 1 tablet (325 mg total) by mouth daily. 10/04/13   Sheral Apley, MD  citalopram (CELEXA) 10 MG tablet Take 1 tablet (10 mg total) by mouth daily. 02/04/13   [name] A [name], MD  docusate sodium (COLACE) 100 MG capsule Take 100 mg by mouth 2 (two) times daily.      Historical Provider, MD  hydrALAZINE (APRESOLINE) 25 MG tablet Take 25 mg by mouth 3 (three) times daily.    Historical Provider, MD  HYDROcodone-acetaminophen (NORCO) 5-325 MG per tablet Take 2 tablets by mouth every 4 (four) hours as needed. 10/04/13   Sheral Apley, MD  lamoTRIgine (LAMICTAL) 25 MG tablet Take 25 mg by mouth 2 (two) times daily.    Historical Provider, MD  levothyroxine (SYNTHROID, LEVOTHROID) 88 MCG tablet Take 88 mcg by mouth daily before breakfast.    Historical Provider, MD  lisinopril (PRINIVIL,ZESTRIL) 5 MG tablet Take 5 mg by mouth every morning.     Historical  Provider, MD  Multiple Vitamin (MULITIVITAMIN WITH MINERALS) TABS Take 1 tablet by mouth every morning.     Historical Provider, MD  ranitidine (ZANTAC) 150 MG capsule Take 1 capsule (150 mg total) by mouth daily. 02/04/13   [name] A [name], MD  risperiDONE (RISPERDAL) 0.25 MG tablet Take 0.25 mg by mouth at bedtime.    Historical Provider, MD  Rivaroxaban (XARELTO) 20 MG TABS tablet Take 20 mg by mouth every morning.    Historical Provider, MD  simvastatin (ZOCOR) 10 MG tablet Take 10 mg by mouth every morning.     Historical Provider, MD  sulfamethoxazole-trimethoprim (SEPTRA) 400-80 MG per tablet Take 1  tablet by mouth 2 (two) times daily. 10/05/13   Meredeth IdeGagan S Lama, MD  tamsulosin (FLOMAX) 0.4 MG CAPS capsule Take 1 capsule (0.4 mg total) by mouth daily. 08/22/13   Jerald KiefStephen K Chiu, MD   BP 0/0 mmHg  Pulse 0  Temp(Src) 99.9 F (37.7 C) (Rectal)  Resp 0  Ht 5\' 4"  (1.626 m)  Wt 100 lb (45.36 kg)  BMI 17.16 kg/m2  SpO2 0% Physical Exam  Constitutional: She appears distressed.  HENT:  Head: Normocephalic and atraumatic.  Dry oropharynx  Eyes: Conjunctivae are normal. No scleral icterus.  Pupils are 5 mm bilaterally, fixed and dilated bilaterally.  Neck: Neck supple. No JVD present. No tracheal deviation present. No thyromegaly present.  Cardiovascular: Regular rhythm and normal heart sounds.  Exam reveals no gallop and no friction rub.   No murmur heard. Tachycardic  Pulmonary/Chest: She is in respiratory distress. She has wheezes. She exhibits no tenderness.  Agonal respirations  Abdominal: Soft. She exhibits distension and mass. There is no tenderness. There is no rebound and no guarding.  Easily reducible hernia seen  Musculoskeletal: She exhibits no edema.  Lymphadenopathy:    She has no cervical adenopathy.  Neurological:  Pupils fixed and dilated. Patient nonresponsive to physical stimuli. She does not move any of her extremities. GCS is 3.  Skin: Skin is warm and dry. No rash noted. No erythema. No pallor.  Nursing note and vitals reviewed.   ED Course  Procedures (including critical care time) Labs Review Labs Reviewed  I-STAT CG4 LACTIC ACID, ED - Abnormal; Notable for the following:    Lactic Acid, Venous 4.25 (*)    All other components within normal limits  I-STAT ARTERIAL BLOOD GAS, ED - Abnormal; Notable for the following:    pH, Arterial 7.122 (*)    pCO2 arterial 86.3 (*)    pO2, Arterial 155.0 (*)    Bicarbonate 27.9 (*)    Acid-base deficit 3.0 (*)    All other components within normal limits    Imaging Review Dg Chest Port 1 View  10-Jan-2016  CLINICAL  DATA:  Sepsis.  Unresponsive. EXAM: PORTABLE CHEST 1 VIEW COMPARISON:  10/03/2013 FINDINGS: Near complete white out of the left chest with volume loss. The right lung is comparatively clear. There is chronic cardiomegaly. Mediastinal contours are distorted; lucent structure over the epigastrium correlates with a known large hiatal hernia. IMPRESSION: 1. Near complete white out on the left with volume loss suggesting collapse. Question aspiration given history of unresponsiveness. 2. Chronic cardiomegaly. 3. Large hiatal hernia. Electronically Signed   By: Marnee SpringJonathon  Watts M.D.   On: 004-May-2017 05:17   I have personally reviewed and evaluated these images and lab results as part of my medical decision-making.   EKG Interpretation   Date/Time:  Thursday November 22 2015 04:56:11 EDT Ventricular Rate:  101 PR Interval:    QRS Duration: 127 QT Interval:  383 QTC Calculation: 496 R Axis:   154 Text Interpretation:  Normal sinus rhythm Paired ventricular premature  complexes Right bundle branch block No significant change since last  tracing Confirmed by Erroll Luna [phone]) on December 05, 2015 6:09:51 AM      MDM   Final diagnoses:  Sepsis, due to unspecified organism [facility])     Patient presents emergency department for altered mental status. She feels warm to touch, this is likely sepsis. Code sepsis was called. Patient does have DO NOT RESUSCITATE paperwork stating that no CPR should be performed. IV fluids and antibiotics are okay which were ordered. Will also obtain CT scan of the head for evaluation for trauma.  A CT scan, patient became bradycardic and hypoxic. She was taken back to the resuscitation bay. Oxygen saturation was 35%, heart rate in the 30s as well. Patient has oxygen on an IV fluids running. Pulses cannot be palpated.  At this point, resuscitation is futile. IV fluids were started, oxygen was taken off.  Time of death is 5:18 AM. I spoke with the medical examiner who did not  take this case.  Family was notified to come to the emergency department.     Tomasita Crumble, MD December 05, 2015 [phone]

## 2015-12-08 NOTE — ED Notes (Signed)
Daughter-in-law en route

## 2015-12-08 NOTE — ED Notes (Signed)
EDP at bedside, pt HR down in 30's, O2 sats 35% on NRB 15L. Pt is DNR, paperwork at bedside

## 2015-12-08 DEATH — deceased
